# Patient Record
Sex: Female | Born: 1990 | Race: White | Hispanic: No | Marital: Married | Smoking: Current every day smoker
Health system: Southern US, Community
[De-identification: ages and names within clinical notes are randomized; demographics above are authoritative.]

## PROBLEM LIST (undated history)

## (undated) ENCOUNTER — Inpatient Hospital Stay (HOSPITAL_COMMUNITY): Payer: Self-pay

## (undated) DIAGNOSIS — B379 Candidiasis, unspecified: Secondary | ICD-10-CM

## (undated) DIAGNOSIS — S82899A Other fracture of unspecified lower leg, initial encounter for closed fracture: Secondary | ICD-10-CM

## (undated) DIAGNOSIS — S62109A Fracture of unspecified carpal bone, unspecified wrist, initial encounter for closed fracture: Secondary | ICD-10-CM

## (undated) DIAGNOSIS — S62609A Fracture of unspecified phalanx of unspecified finger, initial encounter for closed fracture: Secondary | ICD-10-CM

## (undated) DIAGNOSIS — A499 Bacterial infection, unspecified: Secondary | ICD-10-CM

## (undated) DIAGNOSIS — R51 Headache: Secondary | ICD-10-CM

## (undated) DIAGNOSIS — F32A Depression, unspecified: Secondary | ICD-10-CM

## (undated) DIAGNOSIS — I209 Angina pectoris, unspecified: Secondary | ICD-10-CM

## (undated) DIAGNOSIS — O139 Gestational [pregnancy-induced] hypertension without significant proteinuria, unspecified trimester: Secondary | ICD-10-CM

## (undated) DIAGNOSIS — D649 Anemia, unspecified: Secondary | ICD-10-CM

## (undated) DIAGNOSIS — F329 Major depressive disorder, single episode, unspecified: Secondary | ICD-10-CM

## (undated) DIAGNOSIS — Z8619 Personal history of other infectious and parasitic diseases: Secondary | ICD-10-CM

## (undated) DIAGNOSIS — B999 Unspecified infectious disease: Secondary | ICD-10-CM

## (undated) DIAGNOSIS — I10 Essential (primary) hypertension: Secondary | ICD-10-CM

## (undated) DIAGNOSIS — F419 Anxiety disorder, unspecified: Secondary | ICD-10-CM

## (undated) DIAGNOSIS — R0602 Shortness of breath: Secondary | ICD-10-CM

## (undated) DIAGNOSIS — IMO0002 Reserved for concepts with insufficient information to code with codable children: Secondary | ICD-10-CM

## (undated) HISTORY — DX: Anxiety disorder, unspecified: F41.9

## (undated) HISTORY — DX: Anemia, unspecified: D64.9

## (undated) HISTORY — DX: Candidiasis, unspecified: B37.9

## (undated) HISTORY — DX: Gestational (pregnancy-induced) hypertension without significant proteinuria, unspecified trimester: O13.9

## (undated) HISTORY — DX: Fracture of unspecified phalanx of unspecified finger, initial encounter for closed fracture: S62.609A

## (undated) HISTORY — DX: Fracture of unspecified carpal bone, unspecified wrist, initial encounter for closed fracture: S62.109A

## (undated) HISTORY — DX: Unspecified infectious disease: B99.9

## (undated) HISTORY — DX: Headache: R51

## (undated) HISTORY — DX: Depression, unspecified: F32.A

## (undated) HISTORY — DX: Major depressive disorder, single episode, unspecified: F32.9

## (undated) HISTORY — DX: Bacterial infection, unspecified: A49.9

## (undated) HISTORY — DX: Personal history of other infectious and parasitic diseases: Z86.19

## (undated) HISTORY — DX: Angina pectoris, unspecified: I20.9

## (undated) HISTORY — DX: Shortness of breath: R06.02

## (undated) HISTORY — DX: Reserved for concepts with insufficient information to code with codable children: IMO0002

## (undated) HISTORY — DX: Essential (primary) hypertension: I10

## (undated) HISTORY — PX: APPENDECTOMY: SHX54

## (undated) HISTORY — DX: Other fracture of unspecified lower leg, initial encounter for closed fracture: S82.899A

---

## 2010-03-24 DIAGNOSIS — O139 Gestational [pregnancy-induced] hypertension without significant proteinuria, unspecified trimester: Secondary | ICD-10-CM

## 2010-03-24 HISTORY — DX: Gestational (pregnancy-induced) hypertension without significant proteinuria, unspecified trimester: O13.9

## 2011-03-25 NOTE — L&D Delivery Note (Signed)
Delivery Note At 1:39 PM a viable female was delivered via Vaginal, Spontaneous Delivery (Presentation: Left Occiput Anterior).  Loose nuchal cord reduced, infant spontaneously rotated to direct OP, mild shoulder dystocia <30 sec relieved w suprapubic pressure and mcroberts, APGAR: 9, 9; weight - pending  Placenta status: Intact, Spontaneous.  Cord: 3 vessels with the following complications: None.  Cord pH: n/a   Anesthesia: Epidural, local  Episiotomy: None Lacerations: 1st degree;LL Periurethral Suture Repair: 3.0 vicryl rapide 4-0 monocryl  Est. Blood Loss (mL): 200  Mom to postpartum.  Baby to nursery-stable. Pt plans to BF, considering BTL - no consent at present   Denym Christenberry M 03/02/2012, 2:28 PM

## 2011-07-16 ENCOUNTER — Encounter (HOSPITAL_COMMUNITY): Payer: Self-pay | Admitting: Family Medicine

## 2011-07-16 ENCOUNTER — Emergency Department (HOSPITAL_COMMUNITY)
Admission: EM | Admit: 2011-07-16 | Discharge: 2011-07-16 | Disposition: A | Discharge status: Home / Self Care | Payer: Medicaid Other | Attending: Emergency Medicine | Admitting: Emergency Medicine

## 2011-07-16 DIAGNOSIS — O219 Vomiting of pregnancy, unspecified: Secondary | ICD-10-CM

## 2011-07-16 DIAGNOSIS — F17200 Nicotine dependence, unspecified, uncomplicated: Secondary | ICD-10-CM | POA: Insufficient documentation

## 2011-07-16 DIAGNOSIS — O210 Mild hyperemesis gravidarum: Secondary | ICD-10-CM | POA: Insufficient documentation

## 2011-07-16 DIAGNOSIS — Z3201 Encounter for pregnancy test, result positive: Secondary | ICD-10-CM

## 2011-07-16 LAB — URINALYSIS, ROUTINE W REFLEX MICROSCOPIC
Bilirubin Urine: NEGATIVE
Hgb urine dipstick: NEGATIVE
Ketones, ur: NEGATIVE mg/dL
Nitrite: NEGATIVE
pH: 7 (ref 5.0–8.0)

## 2011-07-16 LAB — URINE MICROSCOPIC-ADD ON

## 2011-07-16 MED ORDER — ONDANSETRON 8 MG PO TBDP: 8.0000 mg | ORAL_TABLET | Freq: Three times a day (TID) | ORAL | Status: AC | PRN

## 2011-07-16 NOTE — Discharge Instructions (Signed)
Morning Sickness Morning sickness is when you feel sick to your stomach (nauseous) during pregnancy. This nauseous feeling may or may not come with throwing up (vomiting). It often occurs in the morning, but can be a problem any time of day. While morning sickness is unpleasant, it is usually harmless unless you develop severe and continual vomiting (hyperemesis gravidarum). This condition requires more intense treatment. CAUSES  The cause of morning sickness is not completely known but seems to be related to a sudden increase of two hormones:   Human chorionic gonadotropin (hCG).   Estrogen hormone.  These are elevated in the first part of the pregnancy. TREATMENT  Do not use any medicines (prescription, over-the-counter, or herbal) for morning sickness without first talking to your caregiver. Some patients are helped by the following:  Vitamin B6 (25mg every 8 hours) or vitamin B6 shots.   An antihistamine called doxylamine (10mg every 8 hours).   The herbal medication ginger.  HOME CARE INSTRUCTIONS   Taking multivitamins before getting pregnant can prevent or decrease the severity of morning sickness in most women.   Eat a piece of dry toast or unsalted crackers before getting out of bed in the morning.   Eat 5 or 6 small meals a day.   Eat dry and bland foods (rice, baked potato).   Do not drink liquids with your meals. Drink liquids between meals.   Avoid greasy, fatty, and spicy foods.   Get someone to cook for you if the smell of any food causes nausea and vomiting.   Avoid vitamin pills with iron because iron can cause nausea.   Snack on protein foods between meals if you are hungry.   Eat unsweetened gelatins for deserts.   Wear an acupressure wristband (worn for sea sickness) may be helpful.   Acupuncture may be helpful.   Do not smoke.   Get a humidifier to keep the air in your house free of odors.  SEEK MEDICAL CARE IF:   Your home remedies are not working  and you need medication.   You feel dizzy or lightheaded.   You are losing weight.   You need help with your diet.  SEEK IMMEDIATE MEDICAL CARE IF:   You have persistent and uncontrolled nausea and vomiting.   You pass out (faint).   You have a fever.  MAKE SURE YOU:   Understand these instructions.   Will watch your condition.   Will get help right away if you are not doing well or get worse.  Document Released: 05/01/2006 Document Revised: 02/27/2011 Document Reviewed: 02/26/2007 [employer] Patient Information 2012 [employer]. 

## 2011-07-16 NOTE — ED Notes (Signed)
Patient states she has "thrown up about 8 times" since 8am. Has not taken any meds.

## 2011-07-16 NOTE — ED Provider Notes (Signed)
History     CSN: [id number]  Arrival date & time 07/16/11  6295   First MD Initiated Contact with Patient 07/16/11 2014     8:53 PM Patient reports this morning he has had nausea and vomiting x8. Denies abdominal pain, diarrhea, fever, hematochezia, or hematemesis. Reports last menstrual cycle was the beginning of March. Reports usually has abnormal periods. Patient is a 21 y.o. female presenting with vomiting. The history is provided by the patient.  Emesis  This is a new problem. The current episode started 12 to 24 hours ago. The problem occurs 5 to 10 times per day. The problem has not changed since onset.The emesis has an appearance of bilious material. There has been no fever. Pertinent negatives include no abdominal pain, no chills, no cough, no diarrhea, no fever, no headaches and no URI.    Past Medical History  Diagnosis Date  . Asthma     Past Surgical History  Procedure Date  . Appendectomy     History reviewed. No pertinent family history.  History  Substance Use Topics  . Smoking status: Current Everyday Smoker -- 1.0 packs/day    Types: Cigarettes  . Smokeless tobacco: Not on file  . Alcohol Use: Yes     Occasionally    OB History    Grav Para Term Preterm Abortions TAB SAB Ect Mult Living                  Review of Systems  Constitutional: Negative for fever and chills.  Respiratory: Negative for cough and shortness of breath.   Cardiovascular: Negative for chest pain.  Gastrointestinal: Positive for nausea and vomiting. Negative for abdominal pain, diarrhea and constipation.  Genitourinary: Negative for dysuria, urgency, frequency, hematuria, flank pain, vaginal bleeding, vaginal discharge and vaginal pain.  Musculoskeletal: Negative for back pain.  Neurological: Negative for headaches.  All other systems reviewed and are negative.    Allergies  Review of patient's allergies indicates no known allergies.  Home Medications   Current Outpatient  Rx  Name Route Sig Dispense Refill  . ACETAMINOPHEN 500 MG PO TABS Oral Take 500 mg by mouth every 6 (six) hours as needed. Relief migraine pain    . ADULT MULTIVITAMIN W/MINERALS CH Oral Take 1 tablet by mouth daily.      BP 109/56  Pulse 78  Temp(Src) 98.6 F (37 C) (Oral)  Resp 20  Wt 217 lb (98.431 kg)  SpO2 99%  LMP 05/26/2011  Physical Exam  Vitals reviewed. Constitutional: She is oriented to person, place, and time. Vital signs are normal. She appears well-developed and well-nourished.  HENT:  Head: Normocephalic and atraumatic.  Eyes: Conjunctivae are normal. Pupils are equal, round, and reactive to light.  Neck: Normal range of motion. Neck supple.  Cardiovascular: Normal rate, regular rhythm and normal heart sounds.  Exam reveals no friction rub.   No murmur heard. Pulmonary/Chest: Effort normal and breath sounds normal. She has no wheezes. She has no rhonchi. She has no rales. She exhibits no tenderness.  Abdominal: Soft. Bowel sounds are normal. She exhibits no distension and no mass. There is no tenderness. There is no rebound and no guarding.       Obese   Musculoskeletal: Normal range of motion.  Neurological: She is alert and oriented to person, place, and time. Coordination normal.  Skin: Skin is warm and dry. No rash noted. No erythema. No pallor.    ED Course  Procedures   Results for orders  placed during the hospital encounter of 07/16/11  URINALYSIS, ROUTINE W REFLEX MICROSCOPIC      Component Value Range   Color, Urine YELLOW  YELLOW    APPearance CLOUDY (*) CLEAR    Specific Gravity, Urine 1.019  1.005 - 1.030    pH 7.0  5.0 - 8.0    Glucose, UA NEGATIVE  NEGATIVE (mg/dL)   Hgb urine dipstick NEGATIVE  NEGATIVE    Bilirubin Urine NEGATIVE  NEGATIVE    Ketones, ur NEGATIVE  NEGATIVE (mg/dL)   Protein, ur NEGATIVE  NEGATIVE (mg/dL)   Urobilinogen, UA 1.0  0.0 - 1.0 (mg/dL)   Nitrite NEGATIVE  NEGATIVE    Leukocytes, UA LARGE (*) NEGATIVE   POCT  PREGNANCY, URINE      Component Value Range   Preg Test, Ur POSITIVE (*) NEGATIVE   URINE MICROSCOPIC-ADD ON      Component Value Range   Squamous Epithelial / LPF FEW (*) RARE    WBC, UA 11-20  <3 (WBC/hpf)   Bacteria, UA FEW (*) RARE    Urine-Other FEW YEAST     No results found.    MDM    Patient is positive for pregnancy. Will also order a urine culture to rule out UTI. Patient does not currently have UTI symptoms so I do not feel the need to start her on an antibiotic. Discussed starting a prenatal vitamin. Also give prescription for Zofran. Patient agrees with plan and is ready for discharge     Thomasene Lot, Cordelia Poche 07/17/11 1610

## 2011-07-18 LAB — URINE CULTURE

## 2011-07-18 NOTE — ED Provider Notes (Signed)
Medical screening examination/treatment/procedure(s) were performed by non-physician practitioner and as supervising physician I was immediately available for consultation/collaboration.   Samai Corea, MD 07/18/11 0708 

## 2011-08-20 ENCOUNTER — Emergency Department (HOSPITAL_COMMUNITY): Payer: Medicaid Other

## 2011-08-20 ENCOUNTER — Encounter (HOSPITAL_COMMUNITY): Payer: Self-pay | Admitting: Family Medicine

## 2011-08-20 ENCOUNTER — Emergency Department (HOSPITAL_COMMUNITY)
Admission: EM | Admit: 2011-08-20 | Discharge: 2011-08-20 | Disposition: A | Discharge status: Home / Self Care | Payer: Medicaid Other | Attending: Emergency Medicine | Admitting: Emergency Medicine

## 2011-08-20 ENCOUNTER — Other Ambulatory Visit (HOSPITAL_COMMUNITY): Payer: Self-pay

## 2011-08-20 DIAGNOSIS — N949 Unspecified condition associated with female genital organs and menstrual cycle: Secondary | ICD-10-CM

## 2011-08-20 DIAGNOSIS — O210 Mild hyperemesis gravidarum: Secondary | ICD-10-CM | POA: Insufficient documentation

## 2011-08-20 DIAGNOSIS — F17200 Nicotine dependence, unspecified, uncomplicated: Secondary | ICD-10-CM | POA: Insufficient documentation

## 2011-08-20 DIAGNOSIS — O99891 Other specified diseases and conditions complicating pregnancy: Secondary | ICD-10-CM | POA: Insufficient documentation

## 2011-08-20 DIAGNOSIS — O2390 Unspecified genitourinary tract infection in pregnancy, unspecified trimester: Secondary | ICD-10-CM | POA: Insufficient documentation

## 2011-08-20 DIAGNOSIS — R109 Unspecified abdominal pain: Secondary | ICD-10-CM | POA: Insufficient documentation

## 2011-08-20 DIAGNOSIS — N390 Urinary tract infection, site not specified: Secondary | ICD-10-CM | POA: Insufficient documentation

## 2011-08-20 LAB — URINALYSIS, ROUTINE W REFLEX MICROSCOPIC
Bilirubin Urine: NEGATIVE
Glucose, UA: NEGATIVE mg/dL
Specific Gravity, Urine: 1.026 (ref 1.005–1.030)
Urobilinogen, UA: 0.2 mg/dL (ref 0.0–1.0)
pH: 6 (ref 5.0–8.0)

## 2011-08-20 MED ORDER — ONDANSETRON HCL 4 MG/2ML IJ SOLN
4.0000 mg | Freq: Once | INTRAMUSCULAR | Status: AC
  Administered 2011-08-20: 4 mg via INTRAVENOUS
  Filled 2011-08-20: qty 2

## 2011-08-20 MED ORDER — SODIUM CHLORIDE 0.9 % IV SOLN: Freq: Once | INTRAVENOUS | Status: DC

## 2011-08-20 MED ORDER — CEPHALEXIN 500 MG PO CAPS: 500.0000 mg | ORAL_CAPSULE | Freq: Four times a day (QID) | ORAL | Status: AC

## 2011-08-20 MED ORDER — SODIUM CHLORIDE 0.9 % IV BOLUS (SEPSIS)
1000.0000 mL | Freq: Once | INTRAVENOUS | Status: AC
  Administered 2011-08-20: 1000 mL via INTRAVENOUS

## 2011-08-20 NOTE — Discharge Instructions (Signed)

## 2011-08-20 NOTE — ED Provider Notes (Signed)
History     CSN: [id number]  Arrival date & time 08/20/11  1452   First MD Initiated Contact with Patient 08/20/11 1503      Chief Complaint  Patient presents with  . Abdominal Pain  . Nausea  . Emesis    (Consider location/radiation/quality/duration/timing/severity/associated sxs/prior treatment) Patient is a 21 y.o. female presenting with abdominal pain and vomiting. The history is provided by the patient and the spouse.  Abdominal Pain The primary symptoms of the illness include abdominal pain and vomiting.  Emesis  Associated symptoms include abdominal pain.   patient here with vomiting abdominal cramping. She is [redacted] weeks pregnant and has not had any prenatal care. Denies any vaginal bleeding or discharge. No dysuria or flank pain. Denies any fever. She is a G2 P1. No medications taken prior to arrival. Vomiting has been nonbilious.  Past Medical History  Diagnosis Date  . Asthma     Past Surgical History  Procedure Date  . Appendectomy     History reviewed. No pertinent family history.  History  Substance Use Topics  . Smoking status: Current Everyday Smoker -- 0.5 packs/day    Types: Cigarettes  . Smokeless tobacco: Not on file  . Alcohol Use: No    OB History    Grav Para Term Preterm Abortions TAB SAB Ect Mult Living   1               Review of Systems  Gastrointestinal: Positive for vomiting and abdominal pain.  All other systems reviewed and are negative.    Allergies  Review of patient's allergies indicates no known allergies.  Home Medications   Current Outpatient Rx  Name Route Sig Dispense Refill  . PRENATAL MULTIVITAMIN CH Oral Take 1 tablet by mouth daily.      BP 128/72  Pulse 101  Temp(Src) 98.4 F (36.9 C) (Oral)  Resp 16  Wt 214 lb 6 oz (97.24 kg)  SpO2 99%  LMP 05/26/2011  Physical Exam  Nursing note and vitals reviewed. Constitutional: She is oriented to person, place, and time. Vital signs are normal. She appears  well-developed and well-nourished.  Non-toxic appearance. No distress.  HENT:  Head: Normocephalic and atraumatic.  Eyes: Conjunctivae, EOM and lids are normal. Pupils are equal, round, and reactive to light.  Neck: Normal range of motion. Neck supple. No tracheal deviation present. No mass present.  Cardiovascular: Normal rate, regular rhythm and normal heart sounds.  Exam reveals no gallop.   No murmur heard. Pulmonary/Chest: Effort normal and breath sounds normal. No stridor. No respiratory distress. She has no decreased breath sounds. She has no wheezes. She has no rhonchi. She has no rales.  Abdominal: Soft. Normal appearance and bowel sounds are normal. She exhibits no distension. There is no tenderness. There is no rebound and no CVA tenderness.  Musculoskeletal: Normal range of motion. She exhibits no edema and no tenderness.  Neurological: She is alert and oriented to person, place, and time. She has normal strength. No cranial nerve deficit or sensory deficit. GCS eye subscore is 4. GCS verbal subscore is 5. GCS motor subscore is 6.  Skin: Skin is warm and dry. No abrasion and no rash noted.  Psychiatric: She has a normal mood and affect. Her speech is normal and behavior is normal.    ED Course  Procedures (including critical care time)   Labs Reviewed  URINALYSIS, ROUTINE W REFLEX MICROSCOPIC  URINE CULTURE  HCG, QUANTITATIVE, PREGNANCY   No results found.  No diagnosis found.    MDM  Patient's ultrasound shows 11 weeks and 4 day IUP. We'll treat her urinary tract infection and she has arranged followup with a OB doctor        Toy Baker, MD 08/20/11 [phone]

## 2011-08-20 NOTE — ED Notes (Signed)
PT reports having lower abdominal pain with N/V starting this morning. Reports one episode of vomiting. States she is [redacted] weeks pregnant has not seen an OB yet. Reports taking prenatal vitamins. Denies any vaginal bleeding or discharge.

## 2011-08-20 NOTE — ED Notes (Signed)
Patient transported to Ultrasound 

## 2011-08-22 LAB — URINE CULTURE
Colony Count: >=100000
Culture  Setup Time: 201305300231

## 2011-08-28 ENCOUNTER — Ambulatory Visit (INDEPENDENT_AMBULATORY_CARE_PROVIDER_SITE_OTHER): Payer: Medicaid Other | Admitting: Obstetrics and Gynecology

## 2011-08-28 ENCOUNTER — Encounter: Payer: Self-pay | Admitting: Obstetrics and Gynecology

## 2011-08-28 VITALS — BP 110/74 | Ht 63.6 in | Wt 213.0 lb

## 2011-08-28 DIAGNOSIS — J45909 Unspecified asthma, uncomplicated: Secondary | ICD-10-CM

## 2011-08-28 DIAGNOSIS — O3680X0 Pregnancy with inconclusive fetal viability, not applicable or unspecified: Secondary | ICD-10-CM

## 2011-08-28 DIAGNOSIS — N898 Other specified noninflammatory disorders of vagina: Secondary | ICD-10-CM

## 2011-08-28 LAB — POCT OSOM BVBLUE TEST: Bacterial Vaginosis: NEGATIVE

## 2011-08-28 MED ORDER — NICOTINE 14 MG/24HR TD PT24: 1.0000 | MEDICATED_PATCH | TRANSDERMAL | Status: DC

## 2011-08-28 MED ORDER — NICOTINE 7 MG/24HR TD PT24: 1.0000 | MEDICATED_PATCH | TRANSDERMAL | Status: DC

## 2011-08-28 NOTE — Progress Notes (Signed)
Subjective:    Katelyn Munoz is a 21 y.o. female, G3P1011, who presents for follow-up from ER visit 08/20/11 for pelvic pain thought to be due to UTI. Urine culture: >=100,000 COLONIES/ML Culture Multiple bacterial morphotypes present, none predominant. Was given Keflex. Learned of pregnancy 05/02/11. Spotting x1. 05/16/11 was told she had a miscarriage because of transvaginal ultrasound. Light cycle 3/4 to 3/6 and amenorrhea since then.  08/20/11 ultrasound 11+4 weeks normal IUP  EDD 03/01/12   13+2 weeks Pain resolved except some ligament pain.     History   Social History  . Marital Status: Married    Spouse Name: N/A    Number of Children: N/A  . Years of Education: N/A   Social History Main Topics  . Smoking status: Current Everyday Smoker -- 0.5 packs/day    Types: Cigarettes  . Smokeless tobacco: None  . Alcohol Use: No  . Drug Use: No  . Sexually Active: Yes    Birth Control/ Protection: None   Other Topics Concern  . None   Social History Narrative  . None    Menstrual cycle:   LMP: Patient's last menstrual period was 05/26/2011.            The following portions of the patient's history were reviewed and updated as appropriate: allergies, current medications, past family history, past medical history, past social history, past surgical history and problem list.  Review of Systems Pertinent items are noted in HPI. Breast:Negative for breast lump,nipple discharge or nipple retraction Gastrointestinal: Negative for abdominal pain, change in bowel habits or rectal bleeding Urinary:negative   Objective:    BP 110/74  Ht 5' 3.6" (1.615 m)  Wt 213 lb (96.616 kg)  BMI 37.02 kg/m2  LMP 05/26/2011    Weight:  Wt Readings from Last 1 Encounters:  08/28/11 213 lb (96.616 kg)          BMI: Body mass index is 37.02 kg/(m^2).  General Appearance: Alert, appropriate appearance for age. No acute distress HEENT: Grossly normal Neck / Thyroid: Supple, no masses, nodes or  enlargement Lungs: clear to auscultation bilaterally Back: No CVA tenderness Breast Exam: No masses or nodes.No dimpling, nipple retraction or discharge. Cardiovascular: Regular rate and rhythm. S1, S2, no murmur Gastrointestinal: Soft, non-tender, no masses or organomegaly Pelvic Exam: Vulva and vagina appear normal. Bimanual exam reveals normal uterus and adnexa. Uterus C/w 13 weeks. FHR 148 bpm. Pelvis proven to 7 lbs 14 oz Rectovaginal: not indicated Lymphatic Exam: Non-palpable nodes in neck, clavicular, axillary, or inguinal regions Skin: no rash or abnormalities Neurologic: Normal gait and speech, no tremor  Psychiatric: Alert and oriented, appropriate affect.   OSOM ZO:[id number]   Assessment:    Viable early pregnancy    Plan:    pap smear counseled on smoking cessation: Nicotine patches prescribed Prenatal panel drawn today, Pap, GC,Chlamydia Follow-up OB interview and NOB same day. Pt desires Quad screen Will need TOC urine      Bilan Tedesco AMD

## 2011-08-28 NOTE — Progress Notes (Signed)
Pt.came today as a new gyn follow from the ER she was seen on 08/20/2011 due to having some pelvic pain . Pt also stated that she 13w 5 d pregnant due sometime in early December . Pt also stated she has not had no prenatal care.last pap was in 2011.

## 2011-08-29 LAB — PRENATAL PANEL VII
Antibody Screen: NEGATIVE
Basophils Absolute: 0 10*3/uL (ref 0.0–0.1)
Basophils Relative: 0 % (ref 0–1)
Eosinophils Relative: 1 % (ref 0–5)
HCT: 37.8 % (ref 36.0–46.0)
HIV: NONREACTIVE
Hemoglobin: 12.9 g/dL (ref 12.0–15.0)
Lymphocytes Relative: 20 % (ref 12–46)
MCHC: 34.1 g/dL (ref 30.0–36.0)
MCV: 86.7 fL (ref 78.0–100.0)
Monocytes Absolute: 0.7 10*3/uL (ref 0.1–1.0)
Monocytes Relative: 5 % (ref 3–12)
Neutro Abs: 9.7 10*3/uL — ABNORMAL HIGH (ref 1.7–7.7)
RDW: 13.5 % (ref 11.5–15.5)
Rh Type: NEGATIVE

## 2011-09-02 ENCOUNTER — Emergency Department (HOSPITAL_COMMUNITY)
Admission: EM | Admit: 2011-09-02 | Discharge: 2011-09-02 | Disposition: A | Discharge status: Home / Self Care | Payer: Medicaid Other | Attending: Emergency Medicine | Admitting: Emergency Medicine

## 2011-09-02 ENCOUNTER — Encounter (HOSPITAL_COMMUNITY): Payer: Self-pay | Admitting: *Deleted

## 2011-09-02 DIAGNOSIS — M899 Disorder of bone, unspecified: Secondary | ICD-10-CM | POA: Insufficient documentation

## 2011-09-02 DIAGNOSIS — Z3490 Encounter for supervision of normal pregnancy, unspecified, unspecified trimester: Secondary | ICD-10-CM

## 2011-09-02 DIAGNOSIS — N390 Urinary tract infection, site not specified: Secondary | ICD-10-CM | POA: Insufficient documentation

## 2011-09-02 DIAGNOSIS — M549 Dorsalgia, unspecified: Secondary | ICD-10-CM | POA: Insufficient documentation

## 2011-09-02 DIAGNOSIS — O99330 Smoking (tobacco) complicating pregnancy, unspecified trimester: Secondary | ICD-10-CM | POA: Insufficient documentation

## 2011-09-02 DIAGNOSIS — M259 Joint disorder, unspecified: Secondary | ICD-10-CM | POA: Insufficient documentation

## 2011-09-02 DIAGNOSIS — D649 Anemia, unspecified: Secondary | ICD-10-CM | POA: Insufficient documentation

## 2011-09-02 DIAGNOSIS — O99019 Anemia complicating pregnancy, unspecified trimester: Secondary | ICD-10-CM | POA: Insufficient documentation

## 2011-09-02 DIAGNOSIS — O2390 Unspecified genitourinary tract infection in pregnancy, unspecified trimester: Secondary | ICD-10-CM | POA: Insufficient documentation

## 2011-09-02 DIAGNOSIS — O169 Unspecified maternal hypertension, unspecified trimester: Secondary | ICD-10-CM | POA: Insufficient documentation

## 2011-09-02 LAB — URINALYSIS, ROUTINE W REFLEX MICROSCOPIC
Glucose, UA: NEGATIVE mg/dL
Hgb urine dipstick: NEGATIVE
Ketones, ur: NEGATIVE mg/dL
Protein, ur: NEGATIVE mg/dL
Urobilinogen, UA: 1 mg/dL (ref 0.0–1.0)

## 2011-09-02 LAB — PAP IG, CT-NG, RFX HPV ASCU

## 2011-09-02 LAB — URINE MICROSCOPIC-ADD ON

## 2011-09-02 MED ORDER — NITROFURANTOIN MONOHYD MACRO 100 MG PO CAPS: 100.0000 mg | ORAL_CAPSULE | Freq: Two times a day (BID) | ORAL | Status: AC

## 2011-09-02 MED ORDER — CYCLOBENZAPRINE HCL 5 MG PO TABS: 5.0000 mg | ORAL_TABLET | Freq: Three times a day (TID) | ORAL | Status: AC | PRN

## 2011-09-02 NOTE — ED Notes (Signed)
Pt states "was [place] the pool in the apt [place] last night & started having low back pain, goes up into my head and into my hips when I move, am [redacted] weeks pregnant"

## 2011-09-02 NOTE — Discharge Instructions (Signed)

## 2011-09-02 NOTE — ED Provider Notes (Signed)
History     CSN: [id number]  Arrival date & time 09/02/11  1013   First MD Initiated Contact with Patient 09/02/11 1042      Chief Complaint  Patient presents with  . Back Pain    (Consider location/radiation/quality/duration/timing/severity/associated sxs/prior treatment) HPI Comments: Patient with a history of hypertension and currently [redacted] weeks pregnant presents emergency department with chief complaint of back pain.  Patient states that last evening she was picking up her other child when she felt a muscle strain in her lower back.  Patient has been taking Tylenol to treat pain however she states this is not working.  Patient denies any fevers, night sweats, chills, abdominal pain, nausea, vomiting, vaginal bleeding or discharge, abdominal cramping, loss control of bowel or bladder, radiation of pain, urinary symptoms including frequency, hematuria, or dysuria.  No other complaints at this time.    Patient is a 21 y.o. female presenting with back pain. The history is provided by the patient.  Back Pain  Pertinent negatives include no chest pain, no fever, no numbness, no headaches, no abdominal pain, no dysuria and no weakness.    Past Medical History  Diagnosis Date  . Asthma   . Hypertension   . Anemia     Past Surgical History  Procedure Date  . Appendectomy     Family History  Problem Relation Age of Onset  . Diabetes Paternal Grandmother   . Cancer Maternal Grandmother     cervical cancer   . Lung cancer Maternal Grandfather     History  Substance Use Topics  . Smoking status: Current Everyday Smoker -- 0.5 packs/day    Types: Cigarettes  . Smokeless tobacco: Not on file  . Alcohol Use: No    OB History    Grav Para Term Preterm Abortions TAB SAB Ect Mult Living   3 1 1  1  1   1       Review of Systems  Constitutional: Negative for fever, chills and appetite change.  HENT: Negative for congestion.   Eyes: Negative for visual disturbance.    Respiratory: Negative for shortness of breath.   Cardiovascular: Negative for chest pain and leg swelling.  Gastrointestinal: Negative for abdominal pain.  Genitourinary: Negative for dysuria, urgency, frequency, vaginal bleeding, vaginal discharge and difficulty urinating.  Musculoskeletal: Positive for back pain.  Neurological: Negative for dizziness, syncope, weakness, light-headedness, numbness and headaches.  Psychiatric/Behavioral: Negative for confusion.  All other systems reviewed and are negative.    Allergies  Tree extract  Home Medications   Current Outpatient Rx  Name Route Sig Dispense Refill  . PRENATAL MULTIVITAMIN CH Oral Take 1 tablet by mouth daily.      BP 132/68  Pulse 94  Temp(Src) 98.3 F (36.8 C) (Oral)  Resp 17  Wt 214 lb (97.07 kg)  SpO2 100%  LMP 05/26/2011  Physical Exam  Nursing note and vitals reviewed. Constitutional: She is oriented to person, place, and time. She appears well-developed and well-nourished. No distress.  HENT:  Head: Normocephalic and atraumatic.  Eyes: Conjunctivae and EOM are normal. Pupils are equal, round, and reactive to light. No scleral icterus.  Neck: Normal range of motion and full passive range of motion without pain. Neck supple. No spinous process tenderness and no muscular tenderness present. No rigidity. Normal range of motion present. No Brudzinski's sign noted.  Cardiovascular: Normal rate, regular rhythm and intact distal pulses.  Exam reveals no gallop and no friction rub.   No murmur  heard. Pulmonary/Chest: Effort normal and breath sounds normal. No respiratory distress. She has no wheezes. She has no rales. She exhibits no tenderness.  Abdominal:       Gravid abdomen non tender.   Musculoskeletal:       Cervical back: She exhibits normal range of motion, no tenderness, no bony tenderness and no pain.       Thoracic back: She exhibits no tenderness, no bony tenderness and no pain.       Lumbar back: She  exhibits tenderness, bony tenderness and pain. She exhibits no spasm and normal pulse.       Right foot: She exhibits no swelling.       Left foot: She exhibits no swelling.       Bilateral lower extremities nontender without color change, baseline range of motion of extremities with intact distal pulses, capillary refill less than 2 seconds bilaterally.  Pt has increased pain w ROM of lumbar spine. Pain w ambulation, no sign of ataxia.  Neurological: She is alert and oriented to person, place, and time. She has normal strength and normal reflexes. No sensory deficit. Gait (no ataxia, slowed and hunched d/t pain ) abnormal.       Sensation at baseline for light touch in all 4 distal extremities, motor symmetric & bilateral 5/5 (hips: abduction, adduction, flexion; knee: flexion & extension; foot: dorsiflexion, plantar flexion, toes: dorsi flexion) Patellar & ankle reflexes intact.   Skin: Skin is warm and dry. No rash noted. She is not diaphoretic. No erythema. No pallor.  Psychiatric: She has a normal mood and affect.    ED Course  Procedures (including critical care time)  Labs Reviewed - No data to display No results found.   No diagnosis found.    MDM  Pregnancy, back pain, UTI  Pt has been diagnosed with a UTI. Pt is afebrile, no CVA tenderness, normotensive, and denies N/V. Pt to be dc home with antibiotics and instructions to follow up with PCP if symptoms persist. Pt given flexeril for back pain and discussed conservative home methods.           Jaci Carrel, [place] 09/02/11 1209

## 2011-09-02 NOTE — ED Provider Notes (Signed)
Medical screening examination/treatment/procedure(s) were performed by non-physician practitioner and as supervising physician I was immediately available for consultation/collaboration.   Lyanne Co, MD 09/02/11 [phone]

## 2011-09-03 ENCOUNTER — Encounter: Payer: Self-pay | Admitting: Obstetrics and Gynecology

## 2011-09-07 DIAGNOSIS — R87619 Unspecified abnormal cytological findings in specimens from cervix uteri: Secondary | ICD-10-CM

## 2011-09-07 DIAGNOSIS — IMO0002 Reserved for concepts with insufficient information to code with codable children: Secondary | ICD-10-CM

## 2011-09-07 HISTORY — DX: Reserved for concepts with insufficient information to code with codable children: IMO0002

## 2011-09-07 HISTORY — DX: Unspecified abnormal cytological findings in specimens from cervix uteri: R87.619

## 2011-09-08 ENCOUNTER — Telehealth: Payer: Self-pay | Admitting: Obstetrics and Gynecology

## 2011-09-08 NOTE — Telephone Encounter (Signed)
Returned pt's call.  States is having nausea in AM and vomiting 1-2 times.   Able to retain food and fluids the rest of the day.   Suggested she eat snack of dry cereal or crackers during the night and when first awakens, then wait 15-20 min. Before getting up. Advised no fried or greasy foods. May wear Sea Bands.  Other diet suggestions given. Pt declines RX at this time.   To call if no  improvement. Pt verbalizes comprehension.

## 2011-10-07 ENCOUNTER — Encounter: Payer: Self-pay | Admitting: Obstetrics and Gynecology

## 2011-10-07 ENCOUNTER — Ambulatory Visit (INDEPENDENT_AMBULATORY_CARE_PROVIDER_SITE_OTHER): Payer: Medicaid Other | Admitting: Obstetrics and Gynecology

## 2011-10-07 VITALS — BP 100/58 | Wt 205.0 lb

## 2011-10-07 DIAGNOSIS — IMO0002 Reserved for concepts with insufficient information to code with codable children: Secondary | ICD-10-CM | POA: Insufficient documentation

## 2011-10-07 DIAGNOSIS — O360990 Maternal care for other rhesus isoimmunization, unspecified trimester, not applicable or unspecified: Secondary | ICD-10-CM

## 2011-10-07 DIAGNOSIS — Z331 Pregnant state, incidental: Secondary | ICD-10-CM

## 2011-10-07 DIAGNOSIS — B373 Candidiasis of vulva and vagina: Secondary | ICD-10-CM

## 2011-10-07 DIAGNOSIS — Z6791 Unspecified blood type, Rh negative: Secondary | ICD-10-CM

## 2011-10-07 DIAGNOSIS — O26899 Other specified pregnancy related conditions, unspecified trimester: Secondary | ICD-10-CM

## 2011-10-07 DIAGNOSIS — N898 Other specified noninflammatory disorders of vagina: Secondary | ICD-10-CM

## 2011-10-07 LAB — POCT URINALYSIS DIPSTICK: Bilirubin, UA: NEGATIVE

## 2011-10-07 LAB — POCT WET PREP (WET MOUNT)
Bacteria Wet Prep HPF POC: NEGATIVE
WBC, Wet Prep HPF POC: POSITIVE

## 2011-10-07 MED ORDER — MICONAZOLE NITRATE 100 MG VA SUPP: 100.0000 mg | Freq: Every day | VAGINAL | Status: AC

## 2011-10-07 NOTE — Patient Instructions (Signed)
ABCs of Pregnancy A Antepartum care is very important. Be sure you see your doctor and get prenatal care as soon as you think you are pregnant. At this time, you will be tested for infection, genetic abnormalities and potential problems with you and the pregnancy. This is the time to discuss diet, exercise, work, medications, labor, pain medication during labor and the possibility of a cesarean delivery. Ask any questions that may concern you. It is important to see your doctor regularly throughout your pregnancy. Avoid exposure to toxic substances and chemicals - such as cleaning solvents, lead and mercury, some insecticides, and paint. Pregnant women should avoid exposure to paint fumes, and fumes that cause you to feel ill, dizzy or faint. When possible, it is a good idea to have a pre-pregnancy consultation with your caregiver to begin some important recommendations your caregiver suggests such as, taking folic acid, exercising, quitting smoking, avoiding alcoholic beverages, etc. B Breastfeeding is the healthiest choice for both you and your baby. It has many nutritional benefits for the baby and health benefits for the mother. It also creates a very tight and loving bond between the baby and mother. Talk to your doctor, your family and friends, and your employer about how you choose to feed your baby and how they can support you in your decision. Not all birth defects can be prevented, but a woman can take actions that may increase her chance of having a healthy baby. Many birth defects happen very early in pregnancy, sometimes before a woman even knows she is pregnant. Birth defects or abnormalities of any child in your or the father's family should be discussed with your caregiver. Get a good support bra as your breast size changes. Wear it especially when you exercise and when nursing.  C Celebrate the news of your pregnancy with the your spouse/father and family. Childbirth classes are helpful to  take for you and the spouse/father because it helps to understand what happens during the pregnancy, labor and delivery. Cesarean delivery should be discussed with your doctor so you are prepared for that possibility. The pros and cons of circumcision if it is a boy, should be discussed with your pediatrician. Cigarette smoking during pregnancy can result in low birth weight babies. It has been associated with infertility, miscarriages, tubal pregnancies, infant death (mortality) and poor health (morbidity) in childhood. Additionally, cigarette smoking may cause long-term learning disabilities. If you smoke, you should try to quit before getting pregnant and not smoke during the pregnancy. Secondary smoke may also harm a mother and her developing baby. It is a good idea to ask people to stop smoking around you during your pregnancy and after the baby is born. Extra calcium is necessary when you are pregnant and is found in your prenatal vitamin, in dairy products, green leafy vegetables and in calcium supplements. D A healthy diet according to your current weight and height, along with vitamins and mineral supplements should be discussed with your caregiver. Domestic abuse or violence should be made known to your doctor right away to get the situation corrected. Drink more water when you exercise to keep hydrated. Discomfort of your back and legs usually develops and progresses from the middle of the second trimester through to delivery of the baby. This is because of the enlarging baby and uterus, which may also affect your balance. Do not take illegal drugs. Illegal drugs can seriously harm the baby and you. Drink extra fluids (water is best) throughout pregnancy to help   your body keep up with the increases in your blood volume. Drink at least 6 to 8 glasses of water, fruit juice, or milk each day. A good way to know you are drinking enough fluid is when your urine looks almost like clear water or is very light  yellow.  E Eat healthy to get the nutrients you and your unborn baby need. Your meals should include the five basic food groups. Exercise (30 minutes of light to moderate exercise a day) is important and encouraged during pregnancy, if there are no medical problems or problems with the pregnancy. Exercise that causes discomfort or dizziness should be stopped and reported to your caregiver. Emotions during pregnancy can change from being ecstatic to depression and should be understood by you, your partner and your family. F Fetal screening with ultrasound, amniocentesis and monitoring during pregnancy and labor is common and sometimes necessary. Take 400 micrograms of folic acid daily both before, when possible, and during the first few months of pregnancy to reduce the risk of birth defects of the brain and spine. All women who could possibly become pregnant should take a vitamin with folic acid, every day. It is also important to eat a healthy diet with fortified foods (enriched grain products, including cereals, rice, breads, and pastas) and foods with natural sources of folate (orange juice, green leafy vegetables, beans, peanuts, broccoli, asparagus, peas, and lentils). The father should be involved with all aspects of the pregnancy including, the prenatal care, childbirth classes, labor, delivery, and postpartum time. Fathers may also have emotional concerns about being a father, financial needs, and raising a family. G Genetic testing should be done appropriately. It is important to know your family and the father's history. If there have been problems with pregnancies or birth defects in your family, report these to your doctor. Also, genetic counselors can talk with you about the information you might need in making decisions about having a family. You can call a major medical center in your area for help in finding a board-certified genetic counselor. Genetic testing and counseling should be done  before pregnancy when possible, especially if there is a history of problems in the mother's or father's family. Certain ethnic backgrounds are more at risk for genetic defects. H Get familiar with the hospital where you will be having your baby. Get to know how long it takes to get there, the labor and delivery area, and the hospital procedures. Be sure your medical insurance is accepted there. Get your home ready for the baby including, clothes, the baby's room (when possible), furniture and car seat. Hand washing is important throughout the day, especially after handling raw meat and poultry, changing the baby's diaper or using the bathroom. This can help prevent the spread of many bacteria and viruses that cause infection. Your hair may become dry and thinner, but will return to normal a few weeks after the baby is born. Heartburn is a common problem that can be treated by taking antacids recommended by your caregiver, eating smaller meals 5 or 6 times a day, not drinking liquids when eating, drinking between meals and raising the head of your bed 2 to 3 inches. I Insurance to cover you, the baby, doctor and hospital should be reviewed so that you will be prepared to pay any costs not covered by your insurance plan. If you do not have medical insurance, there are usually clinics and services available for you in your community. Take 30 milligrams of iron during   your pregnancy as prescribed by your doctor to reduce the risk of low red blood cells (anemia) later in pregnancy. All women of childbearing age should eat a diet rich in iron. J There should be a joint effort for the mother, father and any other children to adapt to the pregnancy financially, emotionally, and psychologically during the pregnancy. Join a support group for moms-to-be. Or, join a class on parenting or childbirth. Have the family participate when possible. K Know your limits. Let your caregiver know if you experience any of the  following:   Pain of any kind.   Strong cramps.   You develop a lot of weight in a short period of time (5 pounds in 3 to 5 days).   Vaginal bleeding, leaking of amniotic fluid.   Headache, vision problems.   Dizziness, fainting, shortness of breath.   Chest pain.   Fever of 102 F (38.9 C) or higher.   Gush of clear fluid from your vagina.   Painful urination.   Domestic violence.   Irregular heartbeat (palpitations).   Rapid beating of the heart (tachycardia).   Constant feeling sick to your stomach (nauseous) and vomiting.   Trouble walking, fluid retention (edema).   Muscle weakness.   If your baby has decreased activity.   Persistent diarrhea.   Abnormal vaginal discharge.   Uterine contractions at 20-minute intervals.   Back pain that travels down your leg.  L Learn and practice that what you eat and drink should be in moderation and healthy for you and your baby. Legal drugs such as alcohol and caffeine are important issues for pregnant women. There is no safe amount of alcohol a woman can drink while pregnant. Fetal alcohol syndrome, a disorder characterized by growth retardation, facial abnormalities, and central nervous system dysfunction, is caused by a woman's use of alcohol during pregnancy. Caffeine, found in tea, coffee, soft drinks and chocolate, should also be limited. Be sure to read labels when trying to cut down on caffeine during pregnancy. More than 200 foods, beverages, and over-the-counter medications contain caffeine and have a high salt content! There are coffees and teas that do not contain caffeine. M Medical conditions such as diabetes, epilepsy, and high blood pressure should be treated and kept under control before pregnancy when possible, but especially during pregnancy. Ask your caregiver about any medications that may need to be changed or adjusted during pregnancy. If you are currently taking any medications, ask your caregiver if it  is safe to take them while you are pregnant or before getting pregnant when possible. Also, be sure to discuss any herbs or vitamins you are taking. They are medicines, too! Discuss with your doctor all medications, prescribed and over-the-counter, that you are taking. During your prenatal visit, discuss the medications your doctor may give you during labor and delivery. N Never be afraid to ask your doctor or caregiver questions about your health, the progress of the pregnancy, family problems, stressful situations, and recommendation for a pediatrician, if you do not have one. It is better to take all precautions and discuss any questions or concerns you may have during your office visits. It is a good idea to write down your questions before you visit the doctor. O Over-the-counter cough and cold remedies may contain alcohol or other ingredients that should be avoided during pregnancy. Ask your caregiver about prescription, herbs or over-the-counter medications that you are taking or may consider taking while pregnant.  P Physical activity during pregnancy can   benefit both you and your baby by lessening discomfort and fatigue, providing a sense of well-being, and increasing the likelihood of early recovery after delivery. Light to moderate exercise during pregnancy strengthens the belly (abdominal) and back muscles. This helps improve posture. Practicing yoga, walking, swimming, and cycling on a stationary bicycle are usually safe exercises for pregnant women. Avoid scuba diving, exercise at high altitudes (over 3000 feet), skiing, horseback riding, contact sports, etc. Always check with your doctor before beginning any kind of exercise, especially during pregnancy and especially if you did not exercise before getting pregnant. Q Queasiness, stomach upset and morning sickness are common during pregnancy. Eating a couple of crackers or dry toast before getting out of bed. Foods that you normally love may  make you feel sick to your stomach. You may need to substitute other nutritious foods. Eating 5 or 6 small meals a day instead of 3 large ones may make you feel better. Do not drink with your meals, drink between meals. Questions that you have should be written down and asked during your prenatal visits. R Read about and make plans to baby-proof your home. There are important tips for making your home a safer environment for your baby. Review the tips and make your home safer for you and your baby. Read food labels regarding calories, salt and fat content in the food. S Saunas, hot tubs, and steam rooms should be avoided while you are pregnant. Excessive high heat may be harmful during your pregnancy. Your caregiver will screen and examine you for sexually transmitted diseases and genetic disorders during your prenatal visits. Learn the signs of labor. Sexual relations while pregnant is safe unless there is a medical or pregnancy problem and your caregiver advises against it. T Traveling long distances should be avoided especially in the third trimester of your pregnancy. If you do have to travel out of state, be sure to take a copy of your medical records and medical insurance plan with you. You should not travel long distances without seeing your doctor first. Most airlines will not allow you to travel after 36 weeks of pregnancy. Toxoplasmosis is an infection caused by a parasite that can seriously harm an unborn baby. Avoid eating undercooked meat and handling cat litter. Be sure to wear gloves when gardening. Tingling of the hands and fingers is not unusual and is due to fluid retention. This will go away after the baby is born. U Womb (uterus) size increases during the first trimester. Your kidneys will begin to function more efficiently. This may cause you to feel the need to urinate more often. You may also leak urine when sneezing, coughing or laughing. This is due to the growing uterus pressing  against your bladder, which lies directly in front of and slightly under the uterus during the first few months of pregnancy. If you experience burning along with frequency of urination or bloody urine, be sure to tell your doctor. The size of your uterus in the third trimester may cause a problem with your balance. It is advisable to maintain good posture and avoid wearing high heels during this time. An ultrasound of your baby may be necessary during your pregnancy and is safe for you and your baby. V Vaccinations are an important concern for pregnant women. Get needed vaccines before pregnancy. Center for Disease Control (www.cdc.gov) has clear guidelines for the use of vaccines during pregnancy. Review the list, be sure to discuss it with your doctor. Prenatal vitamins are helpful   and healthy for you and the baby. Do not take extra vitamins except what is recommended. Taking too much of certain vitamins can cause overdose problems. Continuous vomiting should be reported to your caregiver. Varicose veins may appear especially if there is a family history of varicose veins. They should subside after the delivery of the baby. Support hose helps if there is leg discomfort. W Being overweight or underweight during pregnancy may cause problems. Try to get within 15 pounds of your ideal weight before pregnancy. Remember, pregnancy is not a time to be dieting! Do not stop eating or start skipping meals as your weight increases. Both you and your baby need the calories and nutrition you receive from a healthy diet. Be sure to consult with your doctor about your diet. There is a formula and diet plan available depending on whether you are overweight or underweight. Your caregiver or nutritionist can help and advise you if necessary. X Avoid X-rays. If you must have dental work or diagnostic tests, tell your dentist or physician that you are pregnant so that extra care can be taken. X-rays should only be taken when  the risks of not taking them outweigh the risk of taking them. If needed, only the minimum amount of radiation should be used. When X-rays are necessary, protective lead shields should be used to cover areas of the body that are not being X-rayed. Y Your baby loves you. Breastfeeding your baby creates a loving and very close bond between the two of you. Give your baby a healthy environment to live in while you are pregnant. Infants and children require constant care and guidance. Their health and safety should be carefully watched at all times. After the baby is born, rest or take a nap when the baby is sleeping. Z Get your ZZZs. Be sure to get plenty of rest. Resting on your side as often as possible, especially on your left side is advised. It provides the best circulation to your baby and helps reduce swelling. Try taking a nap for 30 to 45 minutes in the afternoon when possible. After the baby is born rest or take a nap when the baby is sleeping. Try elevating your feet for that amount of time when possible. It helps the circulation in your legs and helps reduce swelling.  Most information courtesy of the CDC. Document Released: 03/10/2005 Document Revised: 02/27/2011 Document Reviewed: 11/22/2008 [employer] Patient Information 2012 [employer]. 

## 2011-10-07 NOTE — Progress Notes (Signed)
Pt did not complete Keflex prescribed 08/21/11 for UTI.

## 2011-10-07 NOTE — Progress Notes (Signed)
Pt here for New OB exam Pt with complaints right foot numbness and migraines Physical Examination: General appearance - alert, well appearing, and in no distress Mental status - normal mood, behavior, speech, dress, motor activity, and thought processes Neck - supple, no significant adenopathy, thyroid exam: thyroid is normal in size without nodules or tenderness Chest - clear to auscultation, no wheezes, rales or rhonchi, symmetric air entry Heart - normal rate and regular rhythm Abdomen - soft, nontender, nondistended, no masses or organomegaly Breasts - breasts appear normal, no suspicious masses, no skin or nipple changes or axillary nodes Pelvic - normal external genitalia, vulva, vagina, cervix, uterus and adnexa Rectal - normal rectal, no masses, rectal exam not indicated Back exam - full range of motion, no tenderness, palpable spasm or pain on motion Neurological - alert, oriented, normal speech, no focal findings or movement disorder noted Musculoskeletal - no joint tenderness, deformity or swelling Extremities - no edema, redness or tenderness in the calves or thighs Skin - normal coloration and turgor, no rashes, no suspicious skin lesions noted Routine exam Pap sent ASCUS neg HR HPV repeat in one year refer to neurology.  [place] for anantomy @[facility]  RT 1 week.  Monistat for yeast

## 2011-10-07 NOTE — Progress Notes (Signed)
NOB work-up today . Last pap was 08/28/2011 with ascus cultures was neg. Pt stated sometime has numbness in right foot. No other issues today

## 2011-10-07 NOTE — Progress Notes (Signed)
Additional note.   Pt states feels gloomy and very tired. Has never been diagnosed with depression.  Is interested in counseling.  NS

## 2011-10-08 LAB — PRENATAL PANEL VII
Basophils Absolute: 0 10*3/uL (ref 0.0–0.1)
Eosinophils Relative: 1 % (ref 0–5)
HCT: 35.4 % — ABNORMAL LOW (ref 36.0–46.0)
HIV: NONREACTIVE
Hemoglobin: 11.9 g/dL — ABNORMAL LOW (ref 12.0–15.0)
Lymphocytes Relative: 19 % (ref 12–46)
Lymphs Abs: 2.6 10*3/uL (ref 0.7–4.0)
MCV: 87 fL (ref 78.0–100.0)
Monocytes Absolute: 0.6 10*3/uL (ref 0.1–1.0)
Monocytes Relative: 5 % (ref 3–12)
Neutro Abs: 9.8 10*3/uL — ABNORMAL HIGH (ref 1.7–7.7)
RBC: 4.07 MIL/uL (ref 3.87–5.11)
Rh Type: NEGATIVE
Rubella: 50 IU/mL — ABNORMAL HIGH
WBC: 13.1 10*3/uL — ABNORMAL HIGH (ref 4.0–10.5)

## 2011-10-09 LAB — AFP, QUAD SCREEN
Curr Gest Age: 18.3 wks.days
INH: 252.7 pg/mL
Interpretation-AFP: NEGATIVE
MoM for AFP: 1.13
Osb Risk: 1:8540 {titer}
uE3 Mom: 0.91
uE3 Value: 0.8 ng/mL

## 2011-10-09 LAB — CULTURE, OB URINE

## 2011-10-16 ENCOUNTER — Ambulatory Visit (INDEPENDENT_AMBULATORY_CARE_PROVIDER_SITE_OTHER): Payer: Medicaid Other | Admitting: Obstetrics and Gynecology

## 2011-10-16 ENCOUNTER — Ambulatory Visit (INDEPENDENT_AMBULATORY_CARE_PROVIDER_SITE_OTHER): Payer: Medicaid Other

## 2011-10-16 ENCOUNTER — Encounter: Payer: Self-pay | Admitting: Obstetrics and Gynecology

## 2011-10-16 VITALS — BP 112/60 | Wt 213.0 lb

## 2011-10-16 DIAGNOSIS — Z331 Pregnant state, incidental: Secondary | ICD-10-CM

## 2011-10-16 DIAGNOSIS — N390 Urinary tract infection, site not specified: Secondary | ICD-10-CM

## 2011-10-16 DIAGNOSIS — Z3689 Encounter for other specified antenatal screening: Secondary | ICD-10-CM

## 2011-10-16 MED ORDER — NITROFURANTOIN MONOHYD MACRO 100 MG PO CAPS: 100.0000 mg | ORAL_CAPSULE | Freq: Two times a day (BID) | ORAL | Status: AC

## 2011-10-16 NOTE — Progress Notes (Signed)
No complaints

## 2011-10-16 NOTE — Progress Notes (Signed)
The patient says that she does feel sad and emotional.  She agrees to evaluation at the [facility]. Complaints of dysuria.  Urine culture showed 50,000 colonies of Escherichia coli. Macrobid twice each day for 7 days. Ultrasound: Single gestation, normal fluid, normal anatomy except that the stomach was small, female, cervix 3.79 cm, 19 weeks and 5 days. Patient states that her last menstrual period was completely normal.  The patient had an ultrasound performed at 11 weeks and 4 days that confirmed the gestational age based on last menstrual period.  EDC is March 01, 2012. Repeat ultrasound for growth between 26 and 30 weeks. Return to office in 4 weeks. Dr. Stefano Gaul

## 2011-10-20 ENCOUNTER — Telehealth: Payer: Self-pay

## 2011-10-20 MED ORDER — SULFAMETHOXAZOLE-TMP DS 800-160 MG PO TABS: 1.0000 | ORAL_TABLET | Freq: Two times a day (BID) | ORAL | Status: AC

## 2011-10-20 NOTE — Telephone Encounter (Signed)
Spoke with pt rgd labs informed have uti need abx informed abx sent to pharm wil do toc at next visit pt voice understanding

## 2011-10-20 NOTE — Telephone Encounter (Signed)
Message copied by Rolla Plate on Mon Oct 20, 2011 11:52 AM ------      Message from: Jaymes Graff      Created: Sun Oct 19, 2011 11:20 PM       Please tell pt she has a UTI and needs to take Bactrim DS one tab twice a day for five days .  Dispense ten tablets.  She will need a TOC @[facility] 

## 2011-10-23 LAB — US OB COMP + 14 WK

## 2011-11-12 DIAGNOSIS — O2340 Unspecified infection of urinary tract in pregnancy, unspecified trimester: Secondary | ICD-10-CM | POA: Insufficient documentation

## 2011-11-13 ENCOUNTER — Encounter: Payer: Self-pay | Admitting: Obstetrics and Gynecology

## 2011-11-13 ENCOUNTER — Ambulatory Visit (INDEPENDENT_AMBULATORY_CARE_PROVIDER_SITE_OTHER): Payer: Medicaid Other | Admitting: Obstetrics and Gynecology

## 2011-11-13 VITALS — BP 110/68 | Temp 98.9°F | Wt 216.0 lb

## 2011-11-13 DIAGNOSIS — O2340 Unspecified infection of urinary tract in pregnancy, unspecified trimester: Secondary | ICD-10-CM

## 2011-11-13 DIAGNOSIS — N390 Urinary tract infection, site not specified: Secondary | ICD-10-CM

## 2011-11-13 DIAGNOSIS — J029 Acute pharyngitis, unspecified: Secondary | ICD-10-CM

## 2011-11-13 DIAGNOSIS — O2390 Unspecified genitourinary tract infection in pregnancy, unspecified trimester: Secondary | ICD-10-CM

## 2011-11-13 LAB — POCT URINALYSIS DIPSTICK
Blood, UA: NEGATIVE
Nitrite, UA: NEGATIVE
Spec Grav, UA: 1.015
Urobilinogen, UA: NEGATIVE
pH, UA: 6

## 2011-11-13 LAB — RAPID STREP SCREEN (MED CTR MEBANE ONLY): Streptococcus, Group A Screen (Direct): NEGATIVE

## 2011-11-13 NOTE — Progress Notes (Signed)
Sore throat and nasal congestion--felt she had fever last night. Throat red.  Ears clear. No cough. Rapid strep testing done. Glucola [facility], wth Hgb and RPR. Watch FH--slight S>D today. TOC urine culture today--did not complete previous Rx, but no current symptoms Mood improved--did not seek appointment at [facility] Needs Rhophylac plan at [facility].

## 2011-11-13 NOTE — Progress Notes (Signed)
C/o cold sx's since yesterday  No void for clean catch h20 given

## 2011-11-13 NOTE — Addendum Note (Signed)
Addended by: Cornelius Moras on: 11/13/2011 02:15 PM   Modules accepted: Orders

## 2011-11-13 NOTE — Addendum Note (Signed)
Addended by: Janeece Agee on: 11/13/2011 05:26 PM   Modules accepted: Orders

## 2011-11-13 NOTE — Progress Notes (Signed)
Patient did not leave urine specimen--will check TOC [facility].

## 2011-11-14 ENCOUNTER — Telehealth: Payer: Self-pay | Admitting: Obstetrics and Gynecology

## 2011-11-14 NOTE — Telephone Encounter (Signed)
Tc to pt per telephone call. Told pt rapid strep test=negative.

## 2011-11-21 ENCOUNTER — Telehealth: Payer: Self-pay | Admitting: Obstetrics and Gynecology

## 2011-11-21 NOTE — Telephone Encounter (Signed)
Pt called, is 25w 4d, has been having back spasms and lower abd pain, denies any vaginal bldg and does have +fm.  Pt states has tried Tylenol without any relief.  No other meds taken.  Pt advised to try taking Ibuprofen 600 mg Q 6 hours and to make sure she is pushing her fluids.  Also offered pt an appt for next week.  Pt says will try taking the Ibuprofen and pushing fluids first if she gets no relief will call after hours over the weekend or call next week for an appt.  Pt voices understanding.

## 2011-12-11 ENCOUNTER — Other Ambulatory Visit: Payer: Medicaid Other

## 2011-12-11 ENCOUNTER — Ambulatory Visit (INDEPENDENT_AMBULATORY_CARE_PROVIDER_SITE_OTHER): Payer: Medicaid Other | Admitting: Obstetrics and Gynecology

## 2011-12-11 ENCOUNTER — Encounter: Payer: Self-pay | Admitting: Obstetrics and Gynecology

## 2011-12-11 VITALS — BP 120/70 | Wt 221.0 lb

## 2011-12-11 DIAGNOSIS — Z331 Pregnant state, incidental: Secondary | ICD-10-CM

## 2011-12-11 LAB — HEMOGLOBIN: Hemoglobin: 10.9 g/dL — ABNORMAL LOW (ref 12.0–15.0)

## 2011-12-11 NOTE — Progress Notes (Signed)
[redacted]w[redacted]d Rh-.  RhoGAM ordered. Childbirth classes, preterm labor, contraception, diet and exercise all reviewed. Sign tubal papers today. Vasectomy discussed. Glucola, hemoglobin, RPR today. Return to office in 2 weeks. Dr. Stefano Gaul

## 2011-12-11 NOTE — Progress Notes (Signed)
Glucola given today. 

## 2011-12-12 LAB — RPR

## 2011-12-25 ENCOUNTER — Ambulatory Visit (INDEPENDENT_AMBULATORY_CARE_PROVIDER_SITE_OTHER): Payer: Medicaid Other | Admitting: Obstetrics and Gynecology

## 2011-12-25 VITALS — BP 110/60 | Wt 221.0 lb

## 2011-12-25 DIAGNOSIS — Z331 Pregnant state, incidental: Secondary | ICD-10-CM

## 2011-12-25 NOTE — Progress Notes (Signed)
[redacted]w[redacted]d

## 2011-12-25 NOTE — Progress Notes (Signed)
[redacted]w[redacted]d Complains of continued low back pain.  Referred to physical therapy. Hemoglobin 10.9.  Glucola 108.  RPR nonreactive.  Take iron. Return to office in 2 weeks. Dr. Stefano Gaul

## 2011-12-29 ENCOUNTER — Telehealth: Payer: Self-pay | Admitting: Obstetrics and Gynecology

## 2011-12-29 NOTE — Telephone Encounter (Signed)
Several attempts made to call pt to schedule PT appt.  "Person you are trying to call is not reachable."

## 2012-01-06 ENCOUNTER — Ambulatory Visit: Payer: Medicaid Other | Attending: Obstetrics and Gynecology | Admitting: Physical Therapy

## 2012-01-06 DIAGNOSIS — M545 Low back pain, unspecified: Secondary | ICD-10-CM | POA: Insufficient documentation

## 2012-01-06 DIAGNOSIS — IMO0001 Reserved for inherently not codable concepts without codable children: Secondary | ICD-10-CM | POA: Insufficient documentation

## 2012-01-06 DIAGNOSIS — R293 Abnormal posture: Secondary | ICD-10-CM | POA: Insufficient documentation

## 2012-01-07 ENCOUNTER — Ambulatory Visit (INDEPENDENT_AMBULATORY_CARE_PROVIDER_SITE_OTHER): Payer: Medicaid Other | Admitting: Obstetrics and Gynecology

## 2012-01-07 VITALS — BP 100/62 | Wt 221.0 lb

## 2012-01-07 DIAGNOSIS — Z331 Pregnant state, incidental: Secondary | ICD-10-CM

## 2012-01-07 NOTE — Progress Notes (Signed)
C/o: Back pain, pt started physical therapy this week for her back pain.

## 2012-01-07 NOTE — Progress Notes (Signed)
[redacted]w[redacted]d Continue back pain.  She saw physical therapy yesterday.  Stretching exercises recommended. Tylenol as needed. Return office in 2 weeks. Dr. Stefano Gaul

## 2012-01-14 ENCOUNTER — Ambulatory Visit: Payer: Medicaid Other | Admitting: Rehabilitation

## 2012-01-20 ENCOUNTER — Encounter: Payer: Self-pay | Admitting: Obstetrics and Gynecology

## 2012-01-20 ENCOUNTER — Ambulatory Visit (INDEPENDENT_AMBULATORY_CARE_PROVIDER_SITE_OTHER): Payer: Medicaid Other | Admitting: Obstetrics and Gynecology

## 2012-01-20 VITALS — BP 112/58 | Wt 223.0 lb

## 2012-01-20 DIAGNOSIS — Z331 Pregnant state, incidental: Secondary | ICD-10-CM

## 2012-01-20 NOTE — Progress Notes (Signed)
[redacted]w[redacted]d Sees is a physical therapist. Not sure it is helping. Return office in 2 weeks. Dr. Stefano Gaul

## 2012-01-20 NOTE — Progress Notes (Signed)
[redacted]w[redacted]d

## 2012-01-21 ENCOUNTER — Encounter: Payer: Medicaid Other | Admitting: Physical Therapy

## 2012-02-03 ENCOUNTER — Ambulatory Visit (INDEPENDENT_AMBULATORY_CARE_PROVIDER_SITE_OTHER): Payer: Medicaid Other | Admitting: Obstetrics and Gynecology

## 2012-02-03 VITALS — BP 104/62 | Wt 223.0 lb

## 2012-02-03 DIAGNOSIS — Z331 Pregnant state, incidental: Secondary | ICD-10-CM

## 2012-02-03 NOTE — Progress Notes (Signed)
Pt stated she is hurting a lot all over her body.

## 2012-02-03 NOTE — Progress Notes (Signed)
[redacted]w[redacted]d Gonorrhea, chlamydia, and beta strep sent Return to office in 1 week. Dr. Stefano Gaul

## 2012-02-04 LAB — GC/CHLAMYDIA PROBE AMP
CT Probe RNA: NEGATIVE
GC Probe RNA: NEGATIVE

## 2012-02-09 ENCOUNTER — Ambulatory Visit (INDEPENDENT_AMBULATORY_CARE_PROVIDER_SITE_OTHER): Payer: Medicaid Other | Admitting: Obstetrics and Gynecology

## 2012-02-09 VITALS — BP 108/72 | Wt 223.0 lb

## 2012-02-09 DIAGNOSIS — Z331 Pregnant state, incidental: Secondary | ICD-10-CM

## 2012-02-09 NOTE — Progress Notes (Signed)
[redacted]w[redacted]d  Pt desires cervix check today.  GBS Positive   Contractions last night were 7 mins apart, pt states they are getting more intense.

## 2012-02-09 NOTE — Progress Notes (Signed)
[redacted]w[redacted]d Positive beta strep.  GC negative.  Chlamydia negative. Return office in 1 week. Dr. Stefano Gaul

## 2012-02-13 ENCOUNTER — Encounter (HOSPITAL_COMMUNITY): Payer: Self-pay | Admitting: Emergency Medicine

## 2012-02-13 ENCOUNTER — Inpatient Hospital Stay (HOSPITAL_COMMUNITY)
Admission: AD | Admit: 2012-02-13 | Discharge: 2012-02-13 | Disposition: A | Discharge status: Home / Self Care | Payer: Medicaid Other | Source: Ambulatory Visit | Attending: Obstetrics and Gynecology | Admitting: Obstetrics and Gynecology

## 2012-02-13 ENCOUNTER — Emergency Department (HOSPITAL_COMMUNITY)
Admission: EM | Admit: 2012-02-13 | Discharge: 2012-02-13 | Disposition: A | Discharge status: Home / Self Care | Payer: Medicaid Other | Source: Home / Self Care | Attending: Emergency Medicine | Admitting: Emergency Medicine

## 2012-02-13 ENCOUNTER — Encounter (HOSPITAL_COMMUNITY): Payer: Self-pay | Admitting: *Deleted

## 2012-02-13 DIAGNOSIS — Y92009 Unspecified place in unspecified non-institutional (private) residence as the place of occurrence of the external cause: Secondary | ICD-10-CM

## 2012-02-13 DIAGNOSIS — S300XXA Contusion of lower back and pelvis, initial encounter: Secondary | ICD-10-CM

## 2012-02-13 DIAGNOSIS — W010XXA Fall on same level from slipping, tripping and stumbling without subsequent striking against object, initial encounter: Secondary | ICD-10-CM | POA: Insufficient documentation

## 2012-02-13 DIAGNOSIS — O99019 Anemia complicating pregnancy, unspecified trimester: Secondary | ICD-10-CM | POA: Insufficient documentation

## 2012-02-13 DIAGNOSIS — W19XXXA Unspecified fall, initial encounter: Secondary | ICD-10-CM

## 2012-02-13 DIAGNOSIS — D649 Anemia, unspecified: Secondary | ICD-10-CM | POA: Insufficient documentation

## 2012-02-13 DIAGNOSIS — O99891 Other specified diseases and conditions complicating pregnancy: Secondary | ICD-10-CM | POA: Insufficient documentation

## 2012-02-13 DIAGNOSIS — Z3490 Encounter for supervision of normal pregnancy, unspecified, unspecified trimester: Secondary | ICD-10-CM

## 2012-02-13 DIAGNOSIS — O2340 Unspecified infection of urinary tract in pregnancy, unspecified trimester: Secondary | ICD-10-CM

## 2012-02-13 LAB — CBC
MCV: 85.9 fL (ref 78.0–100.0)
Platelets: 315 10*3/uL (ref 150–400)
RBC: 3.41 MIL/uL — ABNORMAL LOW (ref 3.87–5.11)
RDW: 13.4 % (ref 11.5–15.5)
WBC: 13.3 10*3/uL — ABNORMAL HIGH (ref 4.0–10.5)

## 2012-02-13 MED ORDER — CYCLOBENZAPRINE HCL 10 MG PO TABS: 10.0000 mg | ORAL_TABLET | Freq: Two times a day (BID) | ORAL | Status: DC | PRN

## 2012-02-13 MED ORDER — CYCLOBENZAPRINE HCL 10 MG PO TABS
10.0000 mg | ORAL_TABLET | Freq: Once | ORAL | Status: AC
  Administered 2012-02-13: 10 mg via ORAL
  Filled 2012-02-13: qty 1

## 2012-02-13 MED ORDER — RHO D IMMUNE GLOBULIN 1500 UNIT/2ML IJ SOLN
300.0000 ug | Freq: Once | INTRAMUSCULAR | Status: AC
  Administered 2012-02-13: 300 ug via INTRAMUSCULAR

## 2012-02-13 NOTE — Progress Notes (Signed)
Dr. Stefano Gaul phoned notified of pt fall and EFM tracing of category I of FHR 140 with moderate variabililty, accels present and no decels. Reported no contractions at this time. Dr. Stefano Gaul stated pt needed to come to [facility] for further evaluation and assessment by him. And that pt could have husband drive her to [facility] once ED cleared pt.

## 2012-02-13 NOTE — Progress Notes (Signed)
[facility] ED called regarding pt who fell around 2-3 a.m. And was feeling dizzy at [redacted] weeks pregnant. OB RR RN in route

## 2012-02-13 NOTE — Progress Notes (Signed)
At bedside. Pt states she fell on "bottom" when up to the bathroom this morning between 2-3 a.m. Pt states she has felt fetal movement since fall, denies vaginal bleeding, or leaking of fluid. Pt denies feeling contractions but states she has back pain since fall. Abd soft nontender. Pt G3P1 at 37 weeks 4 days. Pt states she is seen by Dr. Stefano Gaul for prenatal care and was seen this week.

## 2012-02-13 NOTE — Progress Notes (Signed)
ED RN notified of Dr. Debria Garret request and source of transportation.

## 2012-02-13 NOTE — ED Notes (Addendum)
Upon further assessment pt reports she was dizzy yesterday at 0300 pt fell. Fell backwards, landed on butt, hips are sore 7/10. Denies water breaking. Pt was worried because her md told her "baby was in position for labor/ birth and wanted to make sure nothing was wrong with babys head".  First child born at 40 weeks.   Upon assessment pt not crowning nor has water broken.

## 2012-02-13 NOTE — ED Notes (Signed)
Pt is [redacted] weeks pregnant, pt states she began feeling dizzy last night, pt states it has improved some but has not gone away completely.  Pt goes to [facility] and could not find the phone number for them, pt called [facility] and was told to come here.  Pt denies any complications with this pregnancy, pt is G2 P1.  Pt denies abd pain, vaginal d/c, swelling, or any additional c/o.

## 2012-02-13 NOTE — Progress Notes (Signed)
Lupita Leash at [facility] notified of pt transport by private vehicle as soon as [facility] ED discharges patient.

## 2012-02-13 NOTE — ED Notes (Signed)
Rapid response OB GYN nurse called and on way

## 2012-02-13 NOTE — ED Notes (Signed)
Rapid response obgyn nurse at bedside

## 2012-02-13 NOTE — MAU Provider Note (Signed)
History   Katelyn Munoz is a 21y.o. WF at [redacted]w[redacted]d who presents from [facility] for extended monitoring s/p fall on her buttocks around 0130 this AM.  She was monitored for about 30 min there, and then given option to remain there and Dr. Stefano Gaul would go see her there, or come here to MAU for further eval.  Reports feeling nauseated during the night, and as she was getting up to go to [facility], she said her feet came out from under her, and she landed on her bottom on the floor.  No apparent injuries, lacerations, or abrasions.  No abdominal impact.  Denies ctxs or abdominal pain.  She has pain in her hips and low back which she rates a 7-8/10.  No VB or LOF.  GFM since then.  She did throw up one time overnight, and some nausea since, but has eaten breakfast and kept it down.  No recent illness.  No resp c/o's.  Accompanied by her s.o. And 2y.o. Daughter.  She reports she has not received Rhophylac this pregnancy.    .. Patient Active Problem List  Diagnosis  . Asthma  . Rh negative state in antepartum period  . ASCUS favor benign  . UTI (urinary tract infection) during pregnancy   CSN: [id number]  Arrival date and time: 02/13/12 1224   First Provider Initiated Contact with Patient 02/13/12 1334      Chief Complaint  Patient presents with  . Fall  . Back Pain   HPI  OB History    Grav Para Term Preterm Abortions TAB SAB Ect Mult Living   3 1 1  1  1   1      Obstetric Comments   2012 :PTL AT 54 WK; PIH;  MIGRAINES PP; +GBS; [id number]      Past Medical History  Diagnosis Date  . Hypertension   . Anginal pain AGE 53    OCC SX  . Pneumonia     AS CHILD  . Depression AGE 84    NO MEDS; NOT DIAGNOSED  . Anxiety AGE 84    NO MEDS;  NOT DIAGNOSED  . Pregnancy induced hypertension 2012  . Asthma     CHILDHOOD  . Preterm labor 2012  . Infection     UTI OCC  . Infection     YEAST X 1  . Shortness of breath     WITH ANXIETY  . Anemia     LONG TERM  . Fracture of wrist AGE 56  .  Fracture, finger AGE 56  . Fracture, ankle AGE 13  . Headache     MIGRAINES;  TYLENOL  . H/O varicella   . Abnormal Pap smear 09/07/2011  . Yeast infection   . Bacterial infection     Past Surgical History  Procedure Date  . Appendectomy AGE 27    Family History  Problem Relation Age of Onset  . Diabetes Paternal Grandmother   . Early death Paternal Grandmother 84  . Cancer Maternal Grandmother     cervical cancer   . Lung cancer Maternal Grandfather   . Heart disease Maternal Grandfather   . Parkinsonism Maternal Grandfather   . Depression Mother   . Bipolar disorder Mother   . Drug abuse Mother   . Alcohol abuse Mother   . Arthritis Maternal Aunt   . Hyperlipidemia Maternal Aunt   . Hypertension Maternal Aunt     History  Substance Use Topics  . Smoking status: Current Every Day Smoker --  0.5 packs/day for 7 years    Types: Cigarettes  . Smokeless tobacco: Never Used  . Alcohol Use: No     Comment: RARELY    Allergies:  Allergies  Allergen Reactions  . Tree Extract     Prescriptions prior to admission  Medication Sig Dispense Refill  . diphenhydrAMINE (BENADRYL) 25 mg capsule Take 50 mg by mouth at bedtime as needed. For sleep.      . Prenatal Vit-Fe Fumarate-FA (PRENATAL MULTIVITAMIN) TABS Take 1 tablet by mouth daily.         ROS--see HPI above Physical Exam   Blood pressure 110/93, pulse 109, temperature 98.3 F (36.8 C), temperature source Oral, resp. rate 18, height 5' 3.5" (1.613 m), weight 222 lb 6.4 oz (100.88 kg), last menstrual period 05/26/2011, SpO2 99.00%. .. Filed Vitals:   02/13/12 1248 02/13/12 1305 02/13/12 1624  BP: 116/74 110/93 123/54  Pulse: 103 109 90  Temp: 98.3 F (36.8 C)    TempSrc: Oral    Resp: 18  20  Height: 5' 3.5" (1.613 m)    Weight: 222 lb 6.4 oz (100.88 kg)    SpO2: 99%     Physical Exam  Constitutional: She is oriented to person, place, and time. She appears well-developed and well-nourished. No distress.    HENT:  Head: Normocephalic.  Eyes: Pupils are equal, round, and reactive to light.  Cardiovascular: Normal rate.   Respiratory: Effort normal.  GI: Soft.       gravid  Genitourinary:       Deferred pelvic exam  Neurological: She is alert and oriented to person, place, and time.  Skin: Skin is warm and dry.       Intact; no bruises or abrasions  .Marland Kitchen Results for orders placed during the hospital encounter of 02/13/12 (from the past 24 hour(s))  RH IG WORKUP (INCLUDES ABO/RH)     Status: Normal (Preliminary result)   Collection Time   02/13/12  2:13 PM      Component Value Range   Gestational Age(Wks) 37.4     ABO/RH(D) A NEG     Antibody Screen NEG     Fetal Screen NEG     Unit Number [id number]     Blood Component Type RHIG     Unit division 00     Status of Unit ISSUED     Transfusion Status OK TO TRANSFUSE    CBC     Status: Abnormal   Collection Time   02/13/12  2:13 PM      Component Value Range   WBC 13.3 (*) 4.0 - 10.5 K/uL   RBC 3.41 (*) 3.87 - 5.11 MIL/uL   Hemoglobin 9.9 (*) 12.0 - 15.0 g/dL   HCT 81.1 (*) 91.4 - 78.2 %   MCV 85.9  78.0 - 100.0 fL   MCH 29.0  26.0 - 34.0 pg   MCHC 33.8  30.0 - 36.0 g/dL   RDW 95.6  21.3 - 08.6 %   Platelets 315  150 - 400 K/uL    MAU Course  Procedures 1. Extended monitoring 2. Cbc 3. Rhopylac w/u and injection 4. Flexeril 10mg  po x1  Assessment and Plan  1. [redacted]w[redacted]d 2. S/p fall 3.  A neg and hasn't received Rhophylac this pregnancy 4.  No significant injuries but sore back and hips--some relief w/ flexeril 5. No s/s of labor 6.  Reactive and reassuring FHT 7. Anemia in pregnancy  1.  Following extended monitoring and c/w Dr.  Stringer, pt d/c'd home w/ fall and labor precautions and      FKC 2.  F/u 01/2612 at [facility] as scheduled or prn 3.  Comfort measures disc'd and also Rx'd Flexeril for prn use 4.  Need to discuss iron rich diet and give samples at Tuesday's visit  Maddi Collar H 02/13/2012, 2:59 PM

## 2012-02-13 NOTE — ED Notes (Signed)
Pt escorted to discharge window. Pt verbalized understanding discharge instructions. In no acute distress.  

## 2012-02-13 NOTE — ED Provider Notes (Signed)
History     CSN: [id number]  Arrival date & time 02/13/12  1034   First MD Initiated Contact with Patient 02/13/12 1110      Chief Complaint  Patient presents with  . pregnant, fall last night   . Fall  . Dizziness    (Consider location/radiation/quality/duration/timing/severity/associated sxs/prior treatment) HPI Pt is [redacted]wks pregnant with no complications. States she felt nauseated during the night and got up to go to the bathroom when she had an episode of dizziness and states she slipped falling onto her buttocks. Denies any abdominal injury or head injury. She is complaining of moderate aching low back pain since the fall. No abdominal pain, vaginal bleeding or gush of fluid. Baby has been moving normally. She did not call MAU or Ob this morning as noted in triage note. That was from a previous episode of braxton-hicks.   Past Medical History  Diagnosis Date  . Hypertension   . Anginal pain AGE 9    OCC SX  . Pneumonia     AS CHILD  . Depression AGE 88    NO MEDS; NOT DIAGNOSED  . Anxiety AGE 88    NO MEDS;  NOT DIAGNOSED  . Pregnancy induced hypertension 2012  . Asthma     CHILDHOOD  . Preterm labor 2012  . Infection     UTI OCC  . Infection     YEAST X 1  . Shortness of breath     WITH ANXIETY  . Anemia     LONG TERM  . Fracture of wrist AGE 6  . Fracture, finger AGE 6  . Fracture, ankle AGE 45  . Headache     MIGRAINES;  TYLENOL  . H/O varicella   . Abnormal Pap smear 09/07/2011  . Yeast infection   . Bacterial infection     Past Surgical History  Procedure Date  . Appendectomy AGE 77    Family History  Problem Relation Age of Onset  . Diabetes Paternal Grandmother   . Early death Paternal Grandmother 9  . Cancer Maternal Grandmother     cervical cancer   . Lung cancer Maternal Grandfather   . Heart disease Maternal Grandfather   . Parkinsonism Maternal Grandfather   . Depression Mother   . Bipolar disorder Mother   . Drug abuse Mother     . Alcohol abuse Mother   . Arthritis Maternal Aunt   . Hyperlipidemia Maternal Aunt   . Hypertension Maternal Aunt     History  Substance Use Topics  . Smoking status: Current Every Day Smoker -- 0.5 packs/day for 7 years    Types: Cigarettes  . Smokeless tobacco: Never Used  . Alcohol Use: No     Comment: RARELY    OB History    Grav Para Term Preterm Abortions TAB SAB Ect Mult Living   3 1 1  1  1   1      Obstetric Comments   2012 :PTL AT 46 WK; PIH;  MIGRAINES PP; +GBS; [id number]      Review of Systems All other systems reviewed and are negative except as noted in HPI.   Allergies  Tree extract  Home Medications   Current Outpatient Rx  Name  Route  Sig  Dispense  Refill  . CEPHALEXIN 500 MG PO CAPS   Oral   Take 500 mg by mouth 4 (four) times daily. DID  NOT COMPLETE REGIME         . CYCLOBENZAPRINE  HCL 5 MG PO TABS   Oral   Take 5 mg by mouth 3 (three) times daily as needed.         Marland Kitchen PRENATAL MULTIVITAMIN CH   Oral   Take 1 tablet by mouth daily. PRN           BP 121/68  Pulse 115  Temp 98 F (36.7 C) (Oral)  Resp 18  SpO2 99%  LMP 05/26/2011  Physical Exam  Nursing note and vitals reviewed. Constitutional: She is oriented to person, place, and time. She appears well-developed and well-nourished.  HENT:  Head: Normocephalic and atraumatic.  Eyes: EOM are normal. Pupils are equal, round, and reactive to light.  Neck: Normal range of motion. Neck supple.  Cardiovascular: Normal rate, normal heart sounds and intact distal pulses.   Pulmonary/Chest: Effort normal and breath sounds normal.  Abdominal: Bowel sounds are normal. She exhibits no distension. There is no tenderness. There is no rebound and no guarding.       Gravid  Musculoskeletal: Normal range of motion. She exhibits tenderness (R lumbar paraspinal tenderness, no bony tenderness). She exhibits no edema.  Neurological: She is alert and oriented to person, place, and time. She  has normal strength. No cranial nerve deficit or sensory deficit.  Skin: Skin is warm and dry. No rash noted.  Psychiatric: She has a normal mood and affect.    ED Course  Procedures (including critical care time)  Labs Reviewed - No data to display No results found.   No diagnosis found.    MDM  Rapid OB nurse at bedside to begin tocometry and fetal heart monitor.    12:03 PM Per OB, [facility]. Cleared from our stand-point. Discharged to go directly to MAU.     Charles B. Bernette Mayers, MD 02/13/12 [phone]

## 2012-02-13 NOTE — MAU Note (Signed)
Patient states at 0200 she became nauseated and ran to the bathroom and slipped on water and fell on her buttocks. Went to [facility] for evaluation and then sent to MAU for monitoring. States she has had lower back pain that has been getting worse. Denies contractions, leaking or bleeding and reports good fetal movement.

## 2012-02-14 DIAGNOSIS — Y92009 Unspecified place in unspecified non-institutional (private) residence as the place of occurrence of the external cause: Secondary | ICD-10-CM

## 2012-02-14 DIAGNOSIS — O99013 Anemia complicating pregnancy, third trimester: Secondary | ICD-10-CM | POA: Insufficient documentation

## 2012-02-14 LAB — RH IG WORKUP (INCLUDES ABO/RH)
Antibody Screen: NEGATIVE
Unit division: 0

## 2012-02-17 ENCOUNTER — Ambulatory Visit (INDEPENDENT_AMBULATORY_CARE_PROVIDER_SITE_OTHER): Payer: Medicaid Other | Admitting: Obstetrics and Gynecology

## 2012-02-17 VITALS — BP 110/78 | Wt 222.0 lb

## 2012-02-17 DIAGNOSIS — Z331 Pregnant state, incidental: Secondary | ICD-10-CM

## 2012-02-17 NOTE — Progress Notes (Signed)
[redacted]w[redacted]d  Pt desires Cervix check today Pt c/o head cold her husband states she has a strong hard cough that even makes her nauseas

## 2012-02-17 NOTE — Progress Notes (Signed)
[redacted]w[redacted]d Tired and ready for the baby. Rhonchi heard in both lungs. Sudafed, Robitussin, and Tylenol as needed. Return office in 1 week. Dr. Stefano Gaul

## 2012-02-24 ENCOUNTER — Ambulatory Visit (INDEPENDENT_AMBULATORY_CARE_PROVIDER_SITE_OTHER): Payer: Medicaid Other | Admitting: Obstetrics and Gynecology

## 2012-02-24 VITALS — BP 110/60 | Wt 221.0 lb

## 2012-02-24 DIAGNOSIS — Z331 Pregnant state, incidental: Secondary | ICD-10-CM

## 2012-02-24 MED ORDER — NYSTATIN-TRIAMCINOLONE 100000-0.1 UNIT/GM-% EX OINT: TOPICAL_OINTMENT | Freq: Two times a day (BID) | CUTANEOUS | Status: DC

## 2012-02-24 NOTE — Progress Notes (Signed)
[redacted]w[redacted]d No concerns today

## 2012-02-24 NOTE — Progress Notes (Signed)
[redacted]w[redacted]d Doing well Cervix unchanged, membranes swept Vulva very excoriated and erythematous, pt denies any itching or irritation  Will give rx for mycolog  Pt wants induction at 40wks, will send schedule request  rv'd r/b  rv'd [facility]KC and labor sx's

## 2012-02-25 ENCOUNTER — Telehealth: Payer: Self-pay | Admitting: Obstetrics and Gynecology

## 2012-02-25 ENCOUNTER — Encounter (HOSPITAL_COMMUNITY): Payer: Self-pay | Admitting: *Deleted

## 2012-02-25 ENCOUNTER — Telehealth (HOSPITAL_COMMUNITY): Payer: Self-pay | Admitting: *Deleted

## 2012-02-25 NOTE — Telephone Encounter (Signed)
Induction scheduled for 03/01/12 @ 7:30pm with CHS/SR. -[name]

## 2012-02-25 NOTE — Telephone Encounter (Signed)
Preadmission screen  

## 2012-03-01 ENCOUNTER — Encounter (HOSPITAL_COMMUNITY): Payer: Self-pay

## 2012-03-01 ENCOUNTER — Inpatient Hospital Stay (HOSPITAL_COMMUNITY)
Admission: RE | Admit: 2012-03-01 | Discharge: 2012-03-03 | DRG: 775 | Disposition: A | Discharge status: Home / Self Care | Payer: Medicaid Other | Source: Ambulatory Visit | Attending: Obstetrics and Gynecology | Admitting: Obstetrics and Gynecology

## 2012-03-01 DIAGNOSIS — Z22330 Carrier of Group B streptococcus: Secondary | ICD-10-CM

## 2012-03-01 DIAGNOSIS — O26899 Other specified pregnancy related conditions, unspecified trimester: Secondary | ICD-10-CM | POA: Diagnosis present

## 2012-03-01 DIAGNOSIS — O9903 Anemia complicating the puerperium: Secondary | ICD-10-CM | POA: Diagnosis not present

## 2012-03-01 DIAGNOSIS — Z6791 Unspecified blood type, Rh negative: Secondary | ICD-10-CM | POA: Diagnosis present

## 2012-03-01 DIAGNOSIS — O99820 Streptococcus B carrier state complicating pregnancy: Secondary | ICD-10-CM

## 2012-03-01 DIAGNOSIS — D649 Anemia, unspecified: Secondary | ICD-10-CM

## 2012-03-01 DIAGNOSIS — O480 Post-term pregnancy: Principal | ICD-10-CM | POA: Diagnosis present

## 2012-03-01 DIAGNOSIS — O99892 Other specified diseases and conditions complicating childbirth: Secondary | ICD-10-CM | POA: Diagnosis present

## 2012-03-01 DIAGNOSIS — E669 Obesity, unspecified: Secondary | ICD-10-CM | POA: Diagnosis present

## 2012-03-01 LAB — CBC
HCT: 31.6 % — ABNORMAL LOW (ref 36.0–46.0)
MCH: 28.9 pg (ref 26.0–34.0)
MCHC: 34.2 g/dL (ref 30.0–36.0)
MCV: 84.5 fL (ref 78.0–100.0)
RDW: 13.5 % (ref 11.5–15.5)

## 2012-03-01 MED ORDER — PHENYLEPHRINE 40 MCG/ML (10ML) SYRINGE FOR IV PUSH (FOR BLOOD PRESSURE SUPPORT)
80.0000 ug | PREFILLED_SYRINGE | INTRAVENOUS | Status: DC | PRN
  Filled 2012-03-01: qty 5

## 2012-03-01 MED ORDER — SODIUM CHLORIDE 0.9 % IJ SOLN: 3.0000 mL | Freq: Two times a day (BID) | INTRAMUSCULAR | Status: DC

## 2012-03-01 MED ORDER — LACTATED RINGERS IV SOLN: 500.0000 mL | INTRAVENOUS | Status: DC | PRN

## 2012-03-01 MED ORDER — FENTANYL CITRATE 0.05 MG/ML IJ SOLN: 100.0000 ug | INTRAMUSCULAR | Status: DC | PRN

## 2012-03-01 MED ORDER — PHENYLEPHRINE 40 MCG/ML (10ML) SYRINGE FOR IV PUSH (FOR BLOOD PRESSURE SUPPORT): 80.0000 ug | PREFILLED_SYRINGE | INTRAVENOUS | Status: DC | PRN

## 2012-03-01 MED ORDER — EPHEDRINE 5 MG/ML INJ
10.0000 mg | INTRAVENOUS | Status: DC | PRN
  Filled 2012-03-01: qty 4

## 2012-03-01 MED ORDER — DIPHENHYDRAMINE HCL 50 MG/ML IJ SOLN: 12.5000 mg | INTRAMUSCULAR | Status: DC | PRN

## 2012-03-01 MED ORDER — OXYTOCIN 40 UNITS IN LACTATED RINGERS INFUSION - SIMPLE MED
1.0000 m[IU]/min | INTRAVENOUS | Status: DC
  Administered 2012-03-02: 1 m[IU]/min via INTRAVENOUS

## 2012-03-01 MED ORDER — LIDOCAINE HCL (PF) 1 % IJ SOLN
30.0000 mL | INTRAMUSCULAR | Status: DC | PRN
  Filled 2012-03-01: qty 30

## 2012-03-01 MED ORDER — SODIUM CHLORIDE 0.9 % IV SOLN: 250.0000 mL | INTRAVENOUS | Status: DC | PRN

## 2012-03-01 MED ORDER — HYDROXYZINE HCL 50 MG/ML IM SOLN: 50.0000 mg | Freq: Four times a day (QID) | INTRAMUSCULAR | Status: DC | PRN

## 2012-03-01 MED ORDER — TERBUTALINE SULFATE 1 MG/ML IJ SOLN: 0.2500 mg | Freq: Once | INTRAMUSCULAR | Status: AC | PRN

## 2012-03-01 MED ORDER — LACTATED RINGERS IV SOLN
INTRAVENOUS | Status: DC
  Administered 2012-03-02: 10:00:00 via INTRAVENOUS

## 2012-03-01 MED ORDER — SODIUM CHLORIDE 0.9 % IJ SOLN: 3.0000 mL | INTRAMUSCULAR | Status: DC | PRN

## 2012-03-01 MED ORDER — CITRIC ACID-SODIUM CITRATE 334-500 MG/5ML PO SOLN: 30.0000 mL | ORAL | Status: DC | PRN

## 2012-03-01 MED ORDER — ONDANSETRON HCL 4 MG/2ML IJ SOLN: 4.0000 mg | Freq: Four times a day (QID) | INTRAMUSCULAR | Status: DC | PRN

## 2012-03-01 MED ORDER — HYDROXYZINE HCL 50 MG PO TABS: 50.0000 mg | ORAL_TABLET | Freq: Four times a day (QID) | ORAL | Status: DC | PRN

## 2012-03-01 MED ORDER — IBUPROFEN 600 MG PO TABS: 600.0000 mg | ORAL_TABLET | Freq: Four times a day (QID) | ORAL | Status: DC | PRN

## 2012-03-01 MED ORDER — OXYTOCIN 40 UNITS IN LACTATED RINGERS INFUSION - SIMPLE MED
62.5000 mL/h | INTRAVENOUS | Status: DC
  Filled 2012-03-01: qty 1000

## 2012-03-01 MED ORDER — ZOLPIDEM TARTRATE 5 MG PO TABS
5.0000 mg | ORAL_TABLET | Freq: Every evening | ORAL | Status: DC | PRN
  Administered 2012-03-01: 5 mg via ORAL
  Filled 2012-03-01: qty 1

## 2012-03-01 MED ORDER — MISOPROSTOL 25 MCG QUARTER TABLET
25.0000 ug | ORAL_TABLET | ORAL | Status: DC | PRN
  Administered 2012-03-01 – 2012-03-02 (×2): 25 ug via VAGINAL
  Filled 2012-03-01 (×3): qty 0.25

## 2012-03-01 MED ORDER — ACETAMINOPHEN 325 MG PO TABS: 650.0000 mg | ORAL_TABLET | ORAL | Status: DC | PRN

## 2012-03-01 MED ORDER — FENTANYL 2.5 MCG/ML BUPIVACAINE 1/10 % EPIDURAL INFUSION (WH - ANES)
14.0000 mL/h | INTRAMUSCULAR | Status: DC
  Administered 2012-03-02: 14 mL/h via EPIDURAL
  Filled 2012-03-01: qty 125

## 2012-03-01 MED ORDER — OXYCODONE-ACETAMINOPHEN 5-325 MG PO TABS: 1.0000 | ORAL_TABLET | ORAL | Status: DC | PRN

## 2012-03-01 MED ORDER — OXYTOCIN BOLUS FROM INFUSION
500.0000 mL | INTRAVENOUS | Status: DC
  Administered 2012-03-02: 500 mL via INTRAVENOUS

## 2012-03-01 MED ORDER — EPHEDRINE 5 MG/ML INJ: 10.0000 mg | INTRAVENOUS | Status: DC | PRN

## 2012-03-01 MED ORDER — LACTATED RINGERS IV SOLN
500.0000 mL | Freq: Once | INTRAVENOUS | Status: AC
  Administered 2012-03-02: 500 mL via INTRAVENOUS

## 2012-03-01 NOTE — H&P (Signed)
Katelyn Munoz is a 21 y.o.obese white female presenting for term elective induction of labor.  Reports GFM.  No LOF or VB.  Occ'l ctxs.  cx 3cm/50% in office last week.  No recent fever; has had congestion/cough.  No GI c/o's. No UTI or PIH s/s.  Accompanied by her husband and her daughter.  Prenatal Course: Pt started care at [facility] at [redacted]w[redacted]d.  Had earlier u/s around 11weeks which confirmed EDC.  Normal anatomy scan and Quad screen.  Treated for UTI 3x in pregnancy.  Pt struggled w/ low back pain during pregnancy and did see Physical therapist.  Also struggled w/ fluctuating emotions, feeling sad, tired; referred to [facility] but pt did not keep appt.  At 24 weeks c/o congestion and sore throat, and again started having same symptoms about 2 weeks ago.  Pt fell 02/17/12 at home on her buttocks and had extended monitoring in MAU; she received Rhophylac that day, and that was first time during pregnancy.    OB Hx: G1=SVD  1/'12 At 40 weeks in [place] F=7+14 (72 hr labor G2=SAB 2/'13 at 8 weeks G3=current  .Marland Kitchen Patient Active Problem List  Diagnosis  . Asthma  . Rh negative state in antepartum period  . ASCUS favor benign  . UTI (urinary tract infection) during pregnancy  . Fall at home  . Anemia complicating pregnancy in third trimester  . Obese  . Post-term pregnancy, 40-42 weeks of gestation   Maternal Medical History:  Contractions: Frequency: irregular.   Perceived severity is mild.    Fetal activity: Perceived fetal activity is normal.   Last perceived fetal movement was within the past hour.      OB History    Grav Para Term Preterm Abortions TAB SAB Ect Mult Living   3 1 1  1  1   1      Obstetric Comments   2012 :PTL AT 63 WK; PIH;  MIGRAINES PP; +GBS; [id number]     Past Medical History  Diagnosis Date  . Hypertension   . Anginal pain AGE 21    OCC SX  . Pneumonia     AS CHILD  . Depression AGE 65    NO MEDS; NOT DIAGNOSED  . Anxiety AGE 65    NO MEDS;  NOT DIAGNOSED   . Pregnancy induced hypertension 2012  . Preterm labor 2012  . Infection     UTI OCC  . Infection     YEAST X 1  . Shortness of breath     WITH ANXIETY  . Anemia     LONG TERM  . Fracture of wrist AGE 29  . Fracture, finger AGE 29  . Fracture, ankle AGE 89  . Headache     MIGRAINES;  TYLENOL  . H/O varicella   . Abnormal Pap smear 09/07/2011  . Yeast infection   . Bacterial infection   . Asthma     CHILDHOOD   Past Surgical History  Procedure Date  . Appendectomy AGE 14   Family History: family history includes Alcohol abuse in her father and mother; Arthritis in her father and maternal aunt; Bipolar disorder in her mother; Cancer in her maternal grandmother; Depression in her mother; Diabetes in her paternal grandmother; Drug abuse in her mother; Early death (age of onset:48) in her paternal grandmother; Heart disease in her maternal grandfather; Hyperlipidemia in her maternal aunt; Hypertension in her father and maternal aunt; Lung cancer in her maternal grandfather; and Parkinsonism in her maternal grandfather.  Social History:  reports that she has been smoking Cigarettes.  She has a 3.5 pack-year smoking history. She has never used smokeless tobacco. She reports that she does not drink alcohol or use illicit drugs.   Prenatal Transfer Tool  Maternal Diabetes: No Genetic Screening: Normal Maternal Ultrasounds/Referrals: Normal Fetal Ultrasounds or other Referrals:  None Maternal Substance Abuse:  No Significant Maternal Medications:  None Significant Maternal Lab Results:  Lab values include: Group B Strep negative, Rh negative Other Comments:  None  Review of Systems  Constitutional: Negative.   HENT: Positive for congestion.   Eyes: Negative.   Respiratory: Positive for cough.   Gastrointestinal: Negative.   Genitourinary: Negative.   Skin: Negative.   Neurological: Negative.     Dilation: 2.5 Effacement (%): 60 Station: -3 Exam by:: h Avrohom Mckelvin cnm Blood  pressure 117/71, pulse 103, temperature 98 F (36.7 C), temperature source Oral, resp. rate 20, height 5\' 3"  (1.6 m), weight 221 lb (100.245 kg), last menstrual period 05/26/2011. Maternal Exam:  Uterine Assessment: Contraction strength is mild.  Abdomen: Patient reports no abdominal tenderness. Fetal presentation: vertex  Introitus: Vulva is positive for vulval edema. Pelvis: adequate for delivery.   Cervix: Cervix evaluated by digital exam.     Fetal Exam Fetal Monitor Review: Mode: ultrasound.   Baseline rate: 145.  Variability: moderate (6-25 bpm).   Pattern: accelerations present and no decelerations.    Fetal State Assessment: Category I - tracings are normal.     Physical Exam  Constitutional: She is oriented to person, place, and time. She appears well-developed and well-nourished. No distress.  HENT:  Head: Normocephalic and atraumatic.  Eyes: Pupils are equal, round, and reactive to light.  Cardiovascular: Normal rate.   Respiratory: Effort normal.  GI: Soft.       gravid  Musculoskeletal: She exhibits edema.       1+ BLE edema, some pitting on shins  Neurological: She is alert and oriented to person, place, and time. She has normal reflexes.  Skin: Skin is warm and dry.  Psychiatric: She has a normal mood and affect. Her behavior is normal. Judgment and thought content normal.    Prenatal labs: ABO, Rh: --/--/A NEG (11/22 1413) Antibody: NEG (11/22 1413) Rubella: 50.0 (07/16 1435) RPR: NON REAC (09/19 1143)  HBsAg: NEGATIVE (07/16 1435)  HIV: NON REACTIVE (07/16 1435)  GBS: POSITIVE (11/12 1425)  1hr gtt=108 Pap 08/28/11 ASCUS w/o HR HPV Assessment/Plan: 1. 40 weeks 2. Term elective induction 3. Cat I FHT 4. GBS pos 5.  Recent URI 6. Anemia in pregnancy 7. Rh neg  1. Admit to [facility] w/ Dr. Estanislado Pandy as attending 2. Routine L&D orders 3. cytotec for ripening and Pitocin in AM 4.  PCN w/ ROM or active labor 5.  Anticipate SVD 6. C/w MD prn     Katelyn Munoz H 03/01/2012, 8:30 PM

## 2012-03-01 NOTE — Progress Notes (Signed)
Spoke with steelman cnm , pt may come off monitor at 2200, may do intermittent monitoring.

## 2012-03-02 ENCOUNTER — Encounter (HOSPITAL_COMMUNITY): Payer: Self-pay | Admitting: Anesthesiology

## 2012-03-02 ENCOUNTER — Inpatient Hospital Stay (HOSPITAL_COMMUNITY): Payer: Medicaid Other | Admitting: Anesthesiology

## 2012-03-02 ENCOUNTER — Encounter (HOSPITAL_COMMUNITY): Payer: Self-pay

## 2012-03-02 DIAGNOSIS — O480 Post-term pregnancy: Secondary | ICD-10-CM | POA: Diagnosis present

## 2012-03-02 DIAGNOSIS — O99820 Streptococcus B carrier state complicating pregnancy: Secondary | ICD-10-CM

## 2012-03-02 LAB — TYPE AND SCREEN: Antibody Screen: POSITIVE

## 2012-03-02 MED ORDER — BENZOCAINE-MENTHOL 20-0.5 % EX AERO: 1.0000 "application " | INHALATION_SPRAY | CUTANEOUS | Status: DC | PRN

## 2012-03-02 MED ORDER — MEASLES, MUMPS & RUBELLA VAC ~~LOC~~ INJ
0.5000 mL | INJECTION | Freq: Once | SUBCUTANEOUS | Status: DC
  Filled 2012-03-02: qty 0.5

## 2012-03-02 MED ORDER — FLEET ENEMA 7-19 GM/118ML RE ENEM: 1.0000 | ENEMA | Freq: Every day | RECTAL | Status: DC | PRN

## 2012-03-02 MED ORDER — SIMETHICONE 80 MG PO CHEW: 80.0000 mg | CHEWABLE_TABLET | ORAL | Status: DC | PRN

## 2012-03-02 MED ORDER — DIPHENHYDRAMINE HCL 25 MG PO CAPS
25.0000 mg | ORAL_CAPSULE | Freq: Four times a day (QID) | ORAL | Status: DC | PRN
  Administered 2012-03-02: 25 mg via ORAL
  Filled 2012-03-02: qty 1

## 2012-03-02 MED ORDER — PENICILLIN G POTASSIUM 5000000 UNITS IJ SOLR
5.0000 10*6.[IU] | Freq: Once | INTRAVENOUS | Status: AC
  Administered 2012-03-02: 5 10*6.[IU] via INTRAVENOUS
  Filled 2012-03-02: qty 5

## 2012-03-02 MED ORDER — LANOLIN HYDROUS EX OINT: TOPICAL_OINTMENT | CUTANEOUS | Status: DC | PRN

## 2012-03-02 MED ORDER — TETANUS-DIPHTH-ACELL PERTUSSIS 5-2.5-18.5 LF-MCG/0.5 IM SUSP: 0.5000 mL | Freq: Once | INTRAMUSCULAR | Status: DC

## 2012-03-02 MED ORDER — LIDOCAINE HCL (PF) 1 % IJ SOLN
INTRAMUSCULAR | Status: DC | PRN
  Administered 2012-03-02: 5 mL
  Administered 2012-03-02: 30 mL
  Administered 2012-03-02: 5 mL

## 2012-03-02 MED ORDER — SENNOSIDES-DOCUSATE SODIUM 8.6-50 MG PO TABS
2.0000 | ORAL_TABLET | Freq: Every day | ORAL | Status: DC
  Administered 2012-03-02: 2 via ORAL

## 2012-03-02 MED ORDER — ONDANSETRON HCL 4 MG/2ML IJ SOLN: 4.0000 mg | INTRAMUSCULAR | Status: DC | PRN

## 2012-03-02 MED ORDER — IBUPROFEN 600 MG PO TABS
600.0000 mg | ORAL_TABLET | Freq: Four times a day (QID) | ORAL | Status: DC
  Administered 2012-03-02 – 2012-03-03 (×4): 600 mg via ORAL
  Filled 2012-03-02 (×3): qty 1

## 2012-03-02 MED ORDER — WITCH HAZEL-GLYCERIN EX PADS: 1.0000 "application " | MEDICATED_PAD | CUTANEOUS | Status: DC | PRN

## 2012-03-02 MED ORDER — PRENATAL MULTIVITAMIN CH
1.0000 | ORAL_TABLET | Freq: Every day | ORAL | Status: DC
  Administered 2012-03-03: 1 via ORAL
  Filled 2012-03-02: qty 1

## 2012-03-02 MED ORDER — OXYCODONE-ACETAMINOPHEN 5-325 MG PO TABS: 1.0000 | ORAL_TABLET | ORAL | Status: DC | PRN

## 2012-03-02 MED ORDER — BISACODYL 10 MG RE SUPP: 10.0000 mg | Freq: Every day | RECTAL | Status: DC | PRN

## 2012-03-02 MED ORDER — ONDANSETRON HCL 4 MG PO TABS: 4.0000 mg | ORAL_TABLET | ORAL | Status: DC | PRN

## 2012-03-02 MED ORDER — DIPHENHYDRAMINE HCL 25 MG PO CAPS: 25.0000 mg | ORAL_CAPSULE | Freq: Four times a day (QID) | ORAL | Status: DC | PRN

## 2012-03-02 MED ORDER — PENICILLIN G POTASSIUM 5000000 UNITS IJ SOLR
2.5000 10*6.[IU] | INTRAMUSCULAR | Status: DC
  Administered 2012-03-02: 2.5 10*6.[IU] via INTRAVENOUS
  Filled 2012-03-02 (×5): qty 2.5

## 2012-03-02 MED ORDER — DIBUCAINE 1 % RE OINT: 1.0000 "application " | TOPICAL_OINTMENT | RECTAL | Status: DC | PRN

## 2012-03-02 MED ORDER — ZOLPIDEM TARTRATE 5 MG PO TABS: 5.0000 mg | ORAL_TABLET | Freq: Every evening | ORAL | Status: DC | PRN

## 2012-03-02 NOTE — Progress Notes (Signed)
Subjective: Pt had to be awoken to put on external monitors at 0030.  Resting well s/p Ambien.  First cytotec was at 2100.  Husband asleep at bedside.  Objective: BP 109/73  Pulse 115  Temp 98 F (36.7 C) (Oral)  Resp 20  Ht 5\' 3"  (1.6 m)  Wt 221 lb (100.245 kg)  BMI 39.15 kg/m2  LMP 05/26/2011      FHT:  FHR: 125 bpm, variability: moderate,  accelerations:  Present,  decelerations:  Absent UC:   regular, every 1.5-5 minutes; couplet pattern at times and some tachysystole SVE:   Dilation: 2.5 Effacement (%): 60 Station: -3 Exam by:: H Mckinley Olheiser CNM Deferred cervical exam at present b/c ctx pattern too frequent to proceed w/ next cytotec dose Labs: Lab Results  Component Value Date   WBC 15.4* 03/01/2012   HGB 10.8* 03/01/2012   HCT 31.6* 03/01/2012   MCV 84.5 03/01/2012   PLT 363 03/01/2012    Assessment / Plan: 1. [redacted]w[redacted]d 2. elective term induction 3. GBS pos  Labor: ripening in progress Preeclampsia:  no signs or symptoms of toxicity Fetal Wellbeing:  Category I Pain Control:  Labor support without medications I/D:  n/a Anticipated MOD:  NSVD 1. Will assess ctx pattern over net 1-2 hours to await spacing for next dose, or switch to Pitocin 2. C/w MD prn  Thayne Cindric H 03/02/2012, 1:22 AM

## 2012-03-02 NOTE — Progress Notes (Signed)
Hilary cnm on unit, reviewed tracing, pt contractioning to much for another cytotec, to go ahead and take off monitor. To put pt back on monitor at 3am to re evaluate  uc pattern.

## 2012-03-02 NOTE — Progress Notes (Signed)
Patient ID: Katelyn Munoz, female   DOB: May 29, 1990, 21 y.o.   MRN: [id number] Pt is 21 y.o. and at [redacted]w[redacted]d   .Subjective:  Wants breakfast, was asleep   Objective: BP 118/53  Pulse 72  Temp 97.9 F (36.6 C) (Oral)  Resp 20  Ht 5\' 3"  (1.6 m)  Wt 221 lb (284.132 kg)  BMI 39.15 kg/m2  LMP 05/26/2011   FHT:  FHR: 140 bpm, variability: moderate,  accelerations:  Abscent,  decelerations:  Absent UC:   irregular, every 2-4 minutes SVE:   Dilation: 3 Effacement (%): 70 Station: -3 Exam by:: Steelman CNM  ve deferred at present   Assessment / Plan: IOL, elective for PD   S/p 2 doses of cytotec last dose at 0415  Fetal Wellbeing:  Category I Pain Control:  Epidural when pt desires  Plan:  Will assess cervix after breakfast  Pen G ordered for GBS prophylaxis   Start pitocin per protocol after pt eats   Update physician PRN  Malissa Hippo 03/02/2012, 8:08 AM

## 2012-03-02 NOTE — Progress Notes (Signed)
Subjective: Pt awake, but doesn't discern ctxs unless on her back.  She ambulated after monitors removed around 0130.  Feels some cramping.  Wants to go to sleep and is hungry--didn't eat supper last night before coming to hospital.  Objective: BP 115/61  Pulse 90  Temp 98 F (36.7 C) (Oral)  Resp 20  Ht 5\' 3"  (1.6 m)  Wt 221 lb (100.245 kg)  BMI 39.15 kg/m2  LMP 05/26/2011      FHT:  FHR: 125 bpm, variability: moderate,  accelerations:  Present,  decelerations:  Present occ'l mild variable UC:   irregular, every 2-5 minutes SVE:   Dilation: 3 Effacement (%): 70 Station: -3 Exam by:: Silvia Hightower CNM Remains anterior behind symphysis and to pt's Lt Labs: Lab Results  Component Value Date   WBC 15.4* 03/01/2012   HGB 10.8* 03/01/2012   HCT 31.6* 03/01/2012   MCV 84.5 03/01/2012   PLT 363 03/01/2012    Assessment / Plan: 1. [redacted]w[redacted]d 2. elective IOL 3. GBS pos  Labor: s/p 2nd dose of cytotec Preeclampsia:  no signs or symptoms of toxicity Fetal Wellbeing:  Category I Pain Control:  Labor support without medications I/D:  n/a Anticipated MOD:  NSVD 1. Placed 2nd dose of cytotec and will give pt 25mg  po Benadryl now. 2. CTO closely and c/w MD prn Dewana Ammirati H 03/02/2012, 4:25 AM

## 2012-03-02 NOTE — Progress Notes (Addendum)
Patient ID: Katelyn Munoz, female   DOB: [birth year], 20 y.o.   MRN: [id number] Pt is 21 y.o. and at [redacted]w[redacted]d   .Subjective: Comfortable w epidural    Objective: BP 115/74  Pulse 81  Temp 97.2 F (36.2 C) (Oral)  Resp 20  Ht 5\' 3"  (1.6 m)  Wt 221 lb (100.245 kg)  BMI 39.15 kg/m2  SpO2 100%  LMP 05/26/2011   FHT:  FHR: 130 bpm, variability: moderate,  accelerations:  Present,  decelerations:  Present run of late decels after epidural, now occ variable UC:   regular, every 2 minutes SVE:   Dilation: 4 Effacement (%): 80 Station: -2 Exam by:: Sowder,rNc  AROM'd copious amt clear fluid IUPC placed without difficulty   Assessment / Plan: IOL for PD Early labor GBS pos - rcv'd PCN    Fetal Wellbeing:  Category I and Category II Pain Control:  Epidural  Plan:    Continue pitocin for titration adequate MVU's   Update physician PRN  Collyn Selk M 03/02/2012, 11:50 AM

## 2012-03-02 NOTE — Progress Notes (Signed)
Katelyn Munoz is a 21 y.o. G3P1011 at [redacted]w[redacted]d  Subjective: Feels pressure with contractions.    Objective: BP 116/63  Pulse 74  Temp 97.2 F (36.2 C) (Oral)  Resp 20  Ht 5\' 3"  (1.6 m)  Wt 221 lb (100.245 kg)  BMI 39.15 kg/m2  SpO2 100%  LMP 05/26/2011      FHT:  Cat 2 now UC:   regular, every 2-3 minutes SVE:   Dilation: 10 Effacement (%): 100 Station: +1 Exam by:: Sowder,RNC  Agree with exam.  Pt asked to push and no rapid descent.  Labs: Lab Results  Component Value Date   WBC 15.4* 03/01/2012   HGB 10.8* 03/01/2012   HCT 31.6* 03/01/2012   MCV 84.5 03/01/2012   PLT 363 03/01/2012    Assessment / Plan: P1 at 40wks in labor.  Will allow to labor down for now.  FHT decels have resolved with position change.  Fetal status is overall reassuring.  Asked by ND to standby for possible delivery (she is currently doing minor case in OR and CNM is in another delivery).   Katelyn Munoz Y 03/02/2012, 1:16 PM

## 2012-03-02 NOTE — Anesthesia Procedure Notes (Signed)
Epidural Patient location during procedure: OB Start time: 03/02/2012 9:57 AM  Staffing Anesthesiologist: Brayton Caves R Performed by: anesthesiologist   Preanesthetic Checklist Completed: patient identified, site marked, surgical consent, pre-op evaluation, timeout performed, IV checked, risks and benefits discussed and monitors and equipment checked  Epidural Patient position: sitting Prep: site prepped and draped and DuraPrep Patient monitoring: continuous pulse ox and blood pressure Approach: midline Injection technique: LOR air and LOR saline  Needle:  Needle type: Tuohy  Needle gauge: 17 G Needle length: 9 cm and 9 Needle insertion depth: 7 cm Catheter type: closed end flexible Catheter size: 19 Gauge Catheter at skin depth: 12 cm Test dose: negative  Assessment Events: blood not aspirated, injection not painful, no injection resistance, negative IV test and no paresthesia  Additional Notes Patient identified.  Risk benefits discussed including failed block, incomplete pain control, headache, nerve damage, paralysis, blood pressure changes, nausea, vomiting, reactions to medication both toxic or allergic, and postpartum back pain.  Patient expressed understanding and wished to proceed.  All questions were answered.  Sterile technique used throughout procedure and epidural site dressed with sterile barrier dressing. No paresthesia or other complications noted.The patient did not experience any signs of intravascular injection such as tinnitus or metallic taste in mouth nor signs of intrathecal spread such as rapid motor block. Please see nursing notes for vital signs.

## 2012-03-02 NOTE — Anesthesia Preprocedure Evaluation (Addendum)
Anesthesia Evaluation  Patient identified by MRN, date of birth, ID band Patient awake    Reviewed: Allergy & Precautions, H&P , NPO status , Patient's Chart, lab work & pertinent test results, reviewed documented beta blocker date and time   History of Anesthesia Complications Negative for: history of anesthetic complications  Airway Mallampati: II TM Distance: >3 FB Neck ROM: full    Dental  (+) Teeth Intact   Pulmonary neg pulmonary ROS, shortness of breath, asthma , pneumonia -, resolved,  breath sounds clear to auscultation        Cardiovascular negative cardio ROS  Rhythm:regular Rate:Normal     Neuro/Psych  Headaches, PSYCHIATRIC DISORDERS negative neurological ROS  negative psych ROS   GI/Hepatic negative GI ROS, Neg liver ROS,   Endo/Other  negative endocrine ROSMorbid obesity  Renal/GU negative Renal ROS     Musculoskeletal   Abdominal   Peds  Hematology negative hematology ROS (+) anemia ,   Anesthesia Other Findings Hypertension     Anginal pain AGE 21 OCC SX    Pneumonia   AS CHILD Depression AGE 80 NO MEDS; NOT DIAGNOSED    Anxiety AGE 80 NO MEDS;  NOT DIAGNOSED Pregnancy induced hypertension 2012      Preterm labor 2012   Infection   UTI OCC    Infection   YEAST X 1 Shortness of breath   WITH ANXIETY    Anemia   LONG TERM Fracture of wrist AGE 69      Fracture, finger AGE 69   Fracture, ankle AGE 21      Headache   MIGRAINES;  TYLENOL H/O varicella        Abnormal Pap smear 09/07/2011   Yeast infection        Bacterial infection     Asthma   CHILDHOOD    Reproductive/Obstetrics (+) Pregnancy                          Anesthesia Physical Anesthesia Plan  ASA: III  Anesthesia Plan: Epidural   Post-op Pain Management:    Induction:   Airway Management Planned:   Additional Equipment:   Intra-op Plan:   Post-operative Plan:   Informed Consent: I have reviewed the  patients History and Physical, chart, labs and discussed the procedure including the risks, benefits and alternatives for the proposed anesthesia with the patient or authorized representative who has indicated his/her understanding and acceptance.     Plan Discussed with:   Anesthesia Plan Comments:         Anesthesia Quick Evaluation

## 2012-03-03 DIAGNOSIS — D649 Anemia, unspecified: Secondary | ICD-10-CM

## 2012-03-03 LAB — CBC
HCT: 28.7 % — ABNORMAL LOW (ref 36.0–46.0)
Hemoglobin: 9.6 g/dL — ABNORMAL LOW (ref 12.0–15.0)
MCV: 84.9 fL (ref 78.0–100.0)
RBC: 3.38 MIL/uL — ABNORMAL LOW (ref 3.87–5.11)
WBC: 16.2 10*3/uL — ABNORMAL HIGH (ref 4.0–10.5)

## 2012-03-03 MED ORDER — IBUPROFEN 600 MG PO TABS: 600.0000 mg | ORAL_TABLET | Freq: Four times a day (QID) | ORAL | Status: DC | PRN

## 2012-03-03 MED ORDER — INFLUENZA VIRUS VACC SPLIT PF IM SUSP
0.5000 mL | Freq: Once | INTRAMUSCULAR | Status: AC
  Administered 2012-03-03: 0.5 mL via INTRAMUSCULAR
  Filled 2012-03-03: qty 0.5

## 2012-03-03 MED ORDER — SENNOSIDES-DOCUSATE SODIUM 8.6-50 MG PO TABS: 2.0000 | ORAL_TABLET | Freq: Every day | ORAL | Status: DC

## 2012-03-03 MED ORDER — RHO D IMMUNE GLOBULIN 1500 UNIT/2ML IJ SOLN
300.0000 ug | Freq: Once | INTRAMUSCULAR | Status: AC
  Administered 2012-03-03: 300 ug via INTRAMUSCULAR
  Filled 2012-03-03: qty 2

## 2012-03-03 NOTE — Discharge Summary (Signed)
Physician Discharge Summary  Patient ID: Katelyn Munoz MRN: [id number] DOB/AGE: 1990-04-11 21 y.o.  Admit date: 03/01/2012 Discharge date: 03/03/2012  Admission Diagnoses: [redacted]w[redacted]d   Discharge Diagnoses:  Active Problems:  Rh negative state in antepartum period  SVD (spontaneous vaginal delivery)  Anemia   Discharged Condition: stable  Hospital Course: [redacted]w[redacted]d, SVD, periurethral lac, normal involution, anemia, plans BTL to sign papers today.}  Discharge Exam: Blood pressure 124/75, pulse 73, temperature 97.9 F (36.6 C), temperature source Oral, resp. rate 20, height 5\' 3"  (1.6 m), weight 221 lb (100.245 kg), last menstrual period 05/26/2011, SpO2 100.00%, unknown if currently breastfeeding.  S: comfortable, little bleeding, slept, back pain down spine with change position to lay down  bottlefeeding  O BP 124/75  Pulse 73  Temp 97.9 F (36.6 C) (Oral)  Resp 20  Ht 5\' 3"  (1.6 m)  Wt 221 lb (100.245 kg)  BMI 39.15 kg/m2  SpO2 100%  LMP 05/26/2011  Breastfeeding? Unknown  abd soft, nt, ff  sm flowperineum clean intact  -Homans sign bilaterally,  Trace edema    Component  Value  Date/Time    HGB  9.6*  03/03/2012 0530    HCT  28.7*  03/03/2012 0530       Disposition: 01-Home or Self Care  Discharge Orders    Future Orders Please Complete By Expires   Discharge instructions      Scheduling Instructions:   Purchase iron 325 mg take 2 times daily for 6 weeks, continue prenatal vitamin, do not take vitamin at same time as iron, do not take iron with dairy like milk, yogurt, cheese   Discharge patient          Medication List     As of 03/03/2012  2:37 PM    STOP taking these medications         cyclobenzaprine 10 MG tablet   Commonly known as: FLEXERIL      TAKE these medications         ibuprofen 600 MG tablet   Commonly known as: ADVIL,MOTRIN   Take 1 tablet (600 mg total) by mouth every 6 (six) hours as needed for pain.      nystatin-triamcinolone  ointment   Commonly known as: MYCOLOG   Apply topically 2 (two) times daily.      prenatal multivitamin Tabs   Take 1 tablet by mouth every morning.      senna-docusate 8.6-50 MG per tablet   Commonly known as: Senokot-S   Take 2 tablets by mouth at bedtime.           Follow-up Information    Follow up with [facility]. In 4 weeks.   Contact information:   [address] [place] [address] [phone]       P discussed anemia, plans BTL f/o 4 weeks, OTC iron bid, measures to avoid constipation, discussed c/o of back pain with CRNA and she will revisit with pt.   SignedLavera Guise 03/03/2012, 2:37 PM

## 2012-03-03 NOTE — Progress Notes (Signed)
S: comfortable, little bleeding, slept, back pain down spine with change position to lay down     bottlefeeding O BP 124/75  Pulse 73  Temp 97.9 F (36.6 C) (Oral)  Resp 20  Ht 5\' 3"  (1.6 m)  Wt 221 lb (100.245 kg)  BMI 39.15 kg/m2  SpO2 100%  LMP 05/26/2011  Breastfeeding? Unknown     abd soft, nt, ff      sm  flowperineum clean intact     -Homans sign bilaterally,       Trace edema    Component Value Date/Time   HGB 9.6* 03/03/2012 0530   HCT 28.7* 03/03/2012 0530   A normal involution     Lactating     PP day 1     anemia P discussed anemia, plans BTL f/o 4 weeks, continue care Lavera Guise, CNM

## 2012-03-03 NOTE — Anesthesia Postprocedure Evaluation (Signed)
  Anesthesia Post-op Note  Patient: Katelyn Munoz  Procedure(s) Performed: * No procedures listed *  Patient Location: PACU and Mother/Baby  Anesthesia Type:Epidural  Level of Consciousness: awake, alert , oriented and patient cooperative  Airway and Oxygen Therapy: Patient Spontanous Breathing  Post-op Pain: mild  Post-op Assessment: Patient's Cardiovascular Status Stable, Respiratory Function Stable, Patent Airway, No signs of Nausea or vomiting, Adequate PO intake and Pain level controlled  Post-op Vital Signs: Reviewed and stable  Complications: No apparent anesthesia complications

## 2012-03-03 NOTE — Progress Notes (Signed)
UR chart review completed.  

## 2012-03-04 LAB — RH IG WORKUP (INCLUDES ABO/RH)
ABO/RH(D): A NEG
Fetal Screen: NEGATIVE
Unit division: 0

## 2012-04-01 ENCOUNTER — Encounter: Payer: Self-pay | Admitting: Obstetrics and Gynecology

## 2012-04-01 ENCOUNTER — Ambulatory Visit (INDEPENDENT_AMBULATORY_CARE_PROVIDER_SITE_OTHER): Payer: Medicaid Other | Admitting: Obstetrics and Gynecology

## 2012-04-01 NOTE — Progress Notes (Signed)
HISTORY OF PRESENT ILLNESS  [name] is a 22 y.o. year old female,G3P2012, who presents for a postpartum visit. She had a vaginal delivery of a healthy female on March 02, 2012.  Subjective:  The patient desires permanent sterilization.  Objective:  BP 120/62  Temp 97.8 F (36.6 C)  Ht 5\' 2"  (1.575 m)  Wt 201 lb (91.173 kg)  BMI 36.76 kg/m2   Resp: clear to auscultation bilaterally Cardio: regular rate and rhythm, S1, S2 normal, no murmur, click, rub or gallop GI: soft and nontender  External genitalia: normal general appearance Vaginal: normal without tenderness, induration or masses and relaxation noted Cervix: nontender Adnexa: normal bimanual exam Uterus: normal size shape and consistency  E PDS equals 14  Bottlefeeding  Assessment:  Doing well status post vaginal delivery  Desires sterilization  Plan:  Counseling discussed for her postpartum feelings.  The patient reports that she has a good support system and she feels that their help is most appropriate.  We will schedule a laparoscopic tubal cautery.  The patient will sign tubal papers today.  Followup care at [facility]  The patient will use condoms if she becomes sexually active before her procedure.  Leonard Schwartz M.D.  04/01/2012 7:27 PM    Date of delivery: 03/02/2012 Female Name: Katelyn Munoz Vaginal delivery:yes Cesarean section:no Tubal ligation:no GDM:no Breast Feeding:no Bottle Feeding:yes Post-Partum Blues:yes Abnormal pap:yes Normal GU function: yes Normal GI function:yes Returning to work:no EPDS: Score 14

## 2012-04-02 ENCOUNTER — Encounter: Payer: Self-pay | Admitting: Obstetrics and Gynecology

## 2012-04-30 ENCOUNTER — Telehealth: Payer: Self-pay | Admitting: Obstetrics and Gynecology

## 2012-05-25 ENCOUNTER — Other Ambulatory Visit: Payer: Self-pay | Admitting: Obstetrics and Gynecology

## 2012-05-25 ENCOUNTER — Telehealth: Payer: Self-pay | Admitting: Obstetrics and Gynecology

## 2012-05-25 NOTE — Telephone Encounter (Signed)
Tubal Sterilization procedure scheduled for 06/09/12 @ 1:00pm With AVS. Patient has MCD. -Katelyn Munoz

## 2012-06-01 ENCOUNTER — Telehealth: Payer: Self-pay | Admitting: Obstetrics and Gynecology

## 2012-06-01 NOTE — Telephone Encounter (Signed)
Tubal Sterilization procedure rescheduled to 07/05/12 @ 1:30pm With AVS. Patient has MCD. -[name]

## 2012-06-02 ENCOUNTER — Telehealth: Payer: Self-pay | Admitting: Obstetrics and Gynecology

## 2012-06-02 NOTE — Telephone Encounter (Signed)
Tubal Sterilization procedure scheduled for 06/09/12 @ 1:00pm With AVS. Patient has MCD. -[name]  ° °

## 2012-06-08 ENCOUNTER — Encounter (HOSPITAL_COMMUNITY): Payer: Self-pay | Admitting: *Deleted

## 2012-06-08 ENCOUNTER — Telehealth: Payer: Self-pay | Admitting: Obstetrics and Gynecology

## 2012-06-08 NOTE — H&P (Addendum)
Admission History and Physical Exam for a Gynecology Patient  Katelyn Munoz is a 22 y.o. female, [id number], who presents for a tubal sterilization procedure.  She desires sterilization. She has been followed at the [facility] and [facility] division of [facility].  OB History   Grav Para Term Preterm Abortions TAB SAB Ect Mult Living   3 2 2  1  1   2      Obstetric Comments   2012 :PTL AT 72 WK; PIH;  MIGRAINES PP; +GBS; [id number]      Past Medical History  Diagnosis Date  . Depression AGE 58    NO MEDS; NOT DIAGNOSED  . Anxiety AGE 58    NO MEDS;  NOT DIAGNOSED  . Preterm labor 2012  . Infection     UTI OCC  . Infection     YEAST X 1  . Shortness of breath     WITH ANXIETY  . Anemia     LONG TERM  . Fracture of wrist AGE 8  . Fracture, finger AGE 8  . Fracture, ankle AGE 14  . Headache     MIGRAINES;  TYLENOL  . H/O varicella   . Abnormal Pap smear 09/07/2011  . Yeast infection   . Bacterial infection   . Asthma     CHILDHOOD  . Anginal pain AGE 41    no problems currently  . Hypertension     pregnanacy only  . Pregnancy induced hypertension 2012    No prescriptions prior to admission    Past Surgical History  Procedure Laterality Date  . Appendectomy  AGE 81    Allergies  Allergen Reactions  . Tree Extract     Cedar trees  resp problems if near one boils and rash if contact    Family History: family history includes Alcohol abuse in her father and mother; Arthritis in her father and maternal aunt; Bipolar disorder in her mother; Cancer in her maternal grandmother; Depression in her mother; Diabetes in her paternal grandmother; Drug abuse in her mother; Early death (age of onset: 23) in her paternal grandmother; Heart disease in her maternal grandfather; Hyperlipidemia in her maternal aunt; Hypertension in her father and maternal aunt; Lung cancer in her maternal grandfather; and Parkinsonism in her maternal  grandfather.  Social History:  reports that she has been smoking Cigarettes.  She has a 3.5 pack-year smoking history. She has never used smokeless tobacco. She reports that  drinks alcohol. She reports that she does not use illicit drugs.  Review of systems: See HPI.  Admission Physical Exam:    There is no weight on file to calculate BMI.  Last menstrual period 05/19/2012, not currently breastfeeding.  HEENT:                 Within normal limits Chest:                   Clear Heart:                    Regular rate and rhythm Breasts:                No masses, skin changes, bleeding, or discharge present Abdomen:             Nontender, no masses Extremities:          Grossly normal Neurologic exam: Grossly normal  Pelvic exam:  External genitalia: normal general appearance Vaginal: normal without  tenderness, induration or masses Cervix: normal appearance Adnexa: normal bimanual exam Uterus: normal size shape and consistency  Assessment:  Desires sterilization  Plan:  Tubal sterilization procedure. I attempted to call the patient tonight but none of her telephone numbers are working.   Janine Limbo 06/08/2012

## 2012-06-09 ENCOUNTER — Encounter (HOSPITAL_COMMUNITY)
Admission: RE | Disposition: A | Discharge status: Home / Self Care | Payer: Self-pay | Source: Ambulatory Visit | Attending: Obstetrics and Gynecology

## 2012-06-09 ENCOUNTER — Encounter (HOSPITAL_COMMUNITY): Payer: Self-pay | Admitting: *Deleted

## 2012-06-09 ENCOUNTER — Ambulatory Visit (HOSPITAL_COMMUNITY)
Admission: RE | Admit: 2012-06-09 | Discharge: 2012-06-09 | Disposition: A | Discharge status: Home / Self Care | Payer: Medicaid Other | Source: Ambulatory Visit | Attending: Obstetrics and Gynecology | Admitting: Obstetrics and Gynecology

## 2012-06-09 ENCOUNTER — Encounter (HOSPITAL_COMMUNITY): Payer: Self-pay | Admitting: Registered Nurse

## 2012-06-09 ENCOUNTER — Ambulatory Visit (HOSPITAL_COMMUNITY): Payer: Medicaid Other | Admitting: Registered Nurse

## 2012-06-09 DIAGNOSIS — E669 Obesity, unspecified: Secondary | ICD-10-CM | POA: Insufficient documentation

## 2012-06-09 DIAGNOSIS — Z302 Encounter for sterilization: Secondary | ICD-10-CM | POA: Insufficient documentation

## 2012-06-09 HISTORY — PX: LAPAROSCOPIC TUBAL LIGATION: SHX1937

## 2012-06-09 LAB — SURGICAL PCR SCREEN
MRSA, PCR: NEGATIVE
Staphylococcus aureus: POSITIVE — AB

## 2012-06-09 LAB — CBC
HCT: 34.9 % — ABNORMAL LOW (ref 36.0–46.0)
Hemoglobin: 12.1 g/dL (ref 12.0–15.0)
RBC: 4.2 MIL/uL (ref 3.87–5.11)

## 2012-06-09 LAB — PREGNANCY, URINE: Preg Test, Ur: NEGATIVE

## 2012-06-09 SURGERY — LIGATION, FALLOPIAN TUBE, LAPAROSCOPIC
Anesthesia: General | Site: Abdomen | Laterality: Bilateral | Wound class: Clean

## 2012-06-09 MED ORDER — ROCURONIUM BROMIDE 50 MG/5ML IV SOLN
INTRAVENOUS | Status: AC
  Filled 2012-06-09: qty 1

## 2012-06-09 MED ORDER — LACTATED RINGERS IV SOLN
INTRAVENOUS | Status: DC
  Administered 2012-06-09: 12:00:00 via INTRAVENOUS

## 2012-06-09 MED ORDER — ONDANSETRON HCL 4 MG/2ML IJ SOLN
INTRAMUSCULAR | Status: DC | PRN
  Administered 2012-06-09: 4 mg via INTRAVENOUS

## 2012-06-09 MED ORDER — OXYCODONE-ACETAMINOPHEN 5-325 MG PO TABS
ORAL_TABLET | ORAL | Status: AC
  Administered 2012-06-09: 1 via ORAL
  Filled 2012-06-09: qty 1

## 2012-06-09 MED ORDER — DEXAMETHASONE SODIUM PHOSPHATE 10 MG/ML IJ SOLN
INTRAMUSCULAR | Status: AC
  Filled 2012-06-09: qty 1

## 2012-06-09 MED ORDER — NEOSTIGMINE METHYLSULFATE 1 MG/ML IJ SOLN
INTRAMUSCULAR | Status: DC | PRN
  Administered 2012-06-09: 3 mg via INTRAVENOUS

## 2012-06-09 MED ORDER — PROMETHAZINE HCL 12.5 MG PO TABS: 12.5000 mg | ORAL_TABLET | Freq: Four times a day (QID) | ORAL | Status: AC | PRN

## 2012-06-09 MED ORDER — ROCURONIUM BROMIDE 100 MG/10ML IV SOLN
INTRAVENOUS | Status: DC | PRN
  Administered 2012-06-09: 40 mg via INTRAVENOUS

## 2012-06-09 MED ORDER — LIDOCAINE HCL (CARDIAC) 20 MG/ML IV SOLN
INTRAVENOUS | Status: AC
  Filled 2012-06-09: qty 5

## 2012-06-09 MED ORDER — MIDAZOLAM HCL 2 MG/2ML IJ SOLN
INTRAMUSCULAR | Status: AC
  Filled 2012-06-09: qty 2

## 2012-06-09 MED ORDER — KETOROLAC TROMETHAMINE 30 MG/ML IJ SOLN
INTRAMUSCULAR | Status: DC | PRN
  Administered 2012-06-09: 30 mg via INTRAVENOUS
  Administered 2012-06-09: 30 mg via INTRAMUSCULAR

## 2012-06-09 MED ORDER — BUPIVACAINE-EPINEPHRINE 0.5% -1:200000 IJ SOLN
INTRAMUSCULAR | Status: DC | PRN
  Administered 2012-06-09: 7 mL

## 2012-06-09 MED ORDER — OXYCODONE-ACETAMINOPHEN 5-325 MG PO TABS: 1.0000 | ORAL_TABLET | Freq: Once | ORAL | Status: AC

## 2012-06-09 MED ORDER — LIDOCAINE HCL (CARDIAC) 20 MG/ML IV SOLN
INTRAVENOUS | Status: DC | PRN
  Administered 2012-06-09: 50 mg via INTRAVENOUS

## 2012-06-09 MED ORDER — GLYCOPYRROLATE 0.2 MG/ML IJ SOLN
INTRAMUSCULAR | Status: DC | PRN
  Administered 2012-06-09: 0.6 mg via INTRAVENOUS

## 2012-06-09 MED ORDER — 0.9 % SODIUM CHLORIDE (POUR BTL) OPTIME
TOPICAL | Status: DC | PRN
  Administered 2012-06-09: 1000 mL

## 2012-06-09 MED ORDER — FENTANYL CITRATE 0.05 MG/ML IJ SOLN: 25.0000 ug | INTRAMUSCULAR | Status: DC | PRN

## 2012-06-09 MED ORDER — FENTANYL CITRATE 0.05 MG/ML IJ SOLN
INTRAMUSCULAR | Status: AC
  Filled 2012-06-09: qty 5

## 2012-06-09 MED ORDER — OXYCODONE-ACETAMINOPHEN 5-325 MG PO TABS: 1.0000 | ORAL_TABLET | ORAL | Status: AC | PRN

## 2012-06-09 MED ORDER — PROPOFOL 10 MG/ML IV EMUL
INTRAVENOUS | Status: AC
  Filled 2012-06-09: qty 20

## 2012-06-09 MED ORDER — FENTANYL CITRATE 0.05 MG/ML IJ SOLN
INTRAMUSCULAR | Status: AC
  Administered 2012-06-09: 50 ug via INTRAVENOUS
  Filled 2012-06-09: qty 2

## 2012-06-09 MED ORDER — LACTATED RINGERS IV SOLN
INTRAVENOUS | Status: DC | PRN
  Administered 2012-06-09: 14:00:00 via INTRAVENOUS

## 2012-06-09 MED ORDER — BUPIVACAINE-EPINEPHRINE (PF) 0.5% -1:200000 IJ SOLN
INTRAMUSCULAR | Status: AC
  Filled 2012-06-09: qty 10

## 2012-06-09 MED ORDER — FENTANYL CITRATE 0.05 MG/ML IJ SOLN
INTRAMUSCULAR | Status: DC | PRN
  Administered 2012-06-09 (×2): 100 ug via INTRAVENOUS
  Administered 2012-06-09: 50 ug via INTRAVENOUS

## 2012-06-09 MED ORDER — MUPIROCIN 2 % EX OINT: TOPICAL_OINTMENT | Freq: Two times a day (BID) | CUTANEOUS | Status: DC

## 2012-06-09 MED ORDER — PROPOFOL 10 MG/ML IV BOLUS
INTRAVENOUS | Status: DC | PRN
  Administered 2012-06-09: 200 mg via INTRAVENOUS

## 2012-06-09 MED ORDER — ONDANSETRON HCL 4 MG/2ML IJ SOLN
INTRAMUSCULAR | Status: AC
  Filled 2012-06-09: qty 2

## 2012-06-09 MED ORDER — IBUPROFEN 800 MG PO TABS: 800.0000 mg | ORAL_TABLET | Freq: Three times a day (TID) | ORAL | Status: AC | PRN

## 2012-06-09 MED ORDER — MIDAZOLAM HCL 5 MG/5ML IJ SOLN
INTRAMUSCULAR | Status: DC | PRN
  Administered 2012-06-09: 2 mg via INTRAVENOUS

## 2012-06-09 MED ORDER — MUPIROCIN 2 % EX OINT
TOPICAL_OINTMENT | CUTANEOUS | Status: AC
  Filled 2012-06-09: qty 22

## 2012-06-09 MED ORDER — DEXAMETHASONE SODIUM PHOSPHATE 10 MG/ML IJ SOLN
INTRAMUSCULAR | Status: DC | PRN
  Administered 2012-06-09: 10 mg via INTRAVENOUS

## 2012-06-09 SURGICAL SUPPLY — 16 items
CATH ROBINSON RED A/P 16FR (CATHETERS) ×2 IMPLANT
CHLORAPREP W/TINT 26ML (MISCELLANEOUS) ×2 IMPLANT
CLOTH BEACON ORANGE TIMEOUT ST (SAFETY) ×2 IMPLANT
DERMABOND ADHESIVE PROPEN (GAUZE/BANDAGES/DRESSINGS) ×1
DERMABOND ADVANCED .7 DNX6 (GAUZE/BANDAGES/DRESSINGS) ×1 IMPLANT
DRSG COVADERM PLUS 2X2 (GAUZE/BANDAGES/DRESSINGS) ×2 IMPLANT
GLOVE BIOGEL PI IND STRL 8.5 (GLOVE) ×1 IMPLANT
GLOVE BIOGEL PI INDICATOR 8.5 (GLOVE) ×1
GLOVE ECLIPSE 8.0 STRL XLNG CF (GLOVE) ×4 IMPLANT
GOWN PREVENTION PLUS LG XLONG (DISPOSABLE) ×4 IMPLANT
GOWN PREVENTION PLUS XXLARGE (GOWN DISPOSABLE) ×2 IMPLANT
PACK LAPAROSCOPY BASIN (CUSTOM PROCEDURE TRAY) ×2 IMPLANT
SUT MNCRL AB 4-0 PS2 18 (SUTURE) ×2 IMPLANT
SUT VICRYL 0 UR6 27IN ABS (SUTURE) ×2 IMPLANT
TOWEL OR 17X24 6PK STRL BLUE (TOWEL DISPOSABLE) ×4 IMPLANT
WATER STERILE IRR 1000ML POUR (IV SOLUTION) ×2 IMPLANT

## 2012-06-09 NOTE — Transfer of Care (Signed)
Immediate Anesthesia Transfer of Care Note  Patient: Katelyn Munoz  Procedure(s) Performed: Procedure(s): LAPAROSCOPIC TUBAL LIGATION (Bilateral)  Patient Location: PACU  Anesthesia Type:General  Level of Consciousness: awake, alert , oriented and patient cooperative  Airway & Oxygen Therapy: Patient Spontanous Breathing and Patient connected to nasal cannula oxygen  Post-op Assessment: Report given to PACU RN, Post -op Vital signs reviewed and stable and Patient moving all extremities X 4  Post vital signs: Reviewed and stable  Complications: No apparent anesthesia complications

## 2012-06-09 NOTE — Op Note (Signed)
OPERATIVE NOTE  Katelyn Munoz  DOB:    1990/05/21  MRN:    [id number]  CSN:    [id number]  Date of Surgery:  06/09/2012  Preoperative Diagnosis:  Desires sterilization  Obesity  Postoperative Diagnosis:  Desires sterilization   Obesity  Procedure:  Laparoscopic Tubal Cautery  Surgeon:  Leonard Schwartz, M.D.  Assistant:  None  Anesthetic:  General  Disposition:  Katelyn Munoz is a 22 y.o. year old female,G3P2012, who presents for a tubal sterilization procedure. She understands the indications for her operation as well as the alternative treatment options. She accepts the risk of, but not limited to, anesthetic complications, bleeding, infections, possible damage to the surrounding organs, and possible tubal failure (17 per 1000). The patient decided against bilateral salpingectomies.  Findings:  The uterus, fallopian tubes, and ovaries were normal. The bowel and the liver appeared normal.  Procedure:  The patient was taken to the operating room where a general anesthetic was given. The patient's abdomen, perineum, and vagina were prepped with sterile solution. The bladder was drained of urine. An examination under anesthesia was performed. A Hulka tenaculum was placed inside the uterus. The patient was sterilely draped. The subumbilical area was injected with half percent Marcaine with epinephrine. A subumbilical incision was made and the Veress needle was inserted into the abdominal cavity without difficulty. Proper placement was confirmed using the fluid drop test. A pneumoperitoneum was obtained. The laparoscopic trocar and the laparoscope were substituted for the Veress needle. The pelvic organs were inspected with findings as mentioned above. The right fallopian tube was identified and followed to the fimbriated end. The right fallopian tube was cauterized in its proximal portion in several areas until the tube was completely desiccated. Hemostasis was  adequate. An identical procedure was carried out on the opposite side. Again hemostasis was adequate. The pneumoperitoneum was allowed to escape. All instruments were removed. The subumbilical fascia was closed using 0 Vicryl. The skin was reapproximated using 3-0 Monocryl. Sponge and needle counts were correct. The estimated blood loss was less than 2 cc. The patient tolerated her procedure well. She was awakened from her anesthetic without difficulty. She was transported to the recovery room in stable condition.  Followup instructions:  The patient will return to see Dr. Stefano Gaul in 2 weeks. She was given a copy of the postoperative instructions as prepared by the [facility] for patients who've undergone laparoscopy.  Discharge medications:  Motrin 800 mg tablets            One tablet every 8 hours as needed for moderate pain. Percocet 5/325 tablets          One or two tablets every 4 hours as needed for severe pain. Phenergan 12.5 mg tablets   One tablet every 6 hours as needed for nausea.  Leonard Schwartz, M.D.

## 2012-06-09 NOTE — Preoperative (Signed)
Beta Blockers   Reason not to administer Beta Blockers:Not Applicable 

## 2012-06-09 NOTE — H&P (Signed)
The patient was interviewed and examined today.  The previously documented history and physical examination was reviewed. There are no changes. The operative procedure was reviewed. The risks and benefits were outlined again. The specific risks include, but are not limited to, anesthetic complications, bleeding, infections, and possible damage to the surrounding organs. The patient's questions were answered.  We are ready to proceed as outlined. The likelihood of the patient achieving the goals of this procedure is very likely.   BP 130/80  Pulse 90  Temp(Src) 97.9 F (36.6 C) (Oral)  Resp 20  Ht 5\' 2"  (1.575 m)  Wt 202 lb (91.627 kg)  BMI 36.94 kg/m2  SpO2 99%  LMP 05/17/2012  Breastfeeding? No  CBC    Component Value Date/Time   WBC 9.8 06/09/2012 1130   RBC 4.20 06/09/2012 1130   HGB 12.1 06/09/2012 1130   HCT 34.9* 06/09/2012 1130   PLT 339 06/09/2012 1130   MCV 83.1 06/09/2012 1130   MCH 28.8 06/09/2012 1130   MCHC 34.7 06/09/2012 1130   RDW 14.5 06/09/2012 1130   LYMPHSABS 2.6 10/07/2011 1435   MONOABS 0.6 10/07/2011 1435   EOSABS 0.2 10/07/2011 1435   BASOSABS 0.0 10/07/2011 1435      Leonard Schwartz, M.D.

## 2012-06-09 NOTE — Anesthesia Postprocedure Evaluation (Signed)
  Anesthesia Post-op Note  Anesthesia Post Note  Patient: Katelyn Munoz  Procedure(s) Performed: Procedure(s) (LRB): LAPAROSCOPIC TUBAL LIGATION (Bilateral)  Anesthesia type: General  Patient location: PACU  Post pain: Pain level controlled  Post assessment: Post-op Vital signs reviewed  Last Vitals:  Filed Vitals:   06/09/12 1530  BP: 123/67  Pulse: 80  Temp: 36.6 C  Resp: 21    Post vital signs: Reviewed  Level of consciousness: sedated  Complications: No apparent anesthesia complications

## 2012-06-09 NOTE — Anesthesia Preprocedure Evaluation (Addendum)
Anesthesia Evaluation  Patient identified by MRN, date of birth, ID band Patient awake    Reviewed: Allergy & Precautions, H&P , Patient's Chart, lab work & pertinent test results, reviewed documented beta blocker date and time   Airway Mallampati: II TM Distance: >3 FB Neck ROM: full    Dental no notable dental hx. (+) Poor Dentition   Pulmonary  breath sounds clear to auscultation  Pulmonary exam normal       Cardiovascular Rhythm:regular Rate:Normal     Neuro/Psych    GI/Hepatic   Endo/Other    Renal/GU      Musculoskeletal   Abdominal   Peds  Hematology   Anesthesia Other Findings   Reproductive/Obstetrics                          Anesthesia Physical Anesthesia Plan  ASA: III  Anesthesia Plan: General   Post-op Pain Management:    Induction: Intravenous  Airway Management Planned: Oral ETT  Additional Equipment:   Intra-op Plan:   Post-operative Plan:   Informed Consent: I have reviewed the patients History and Physical, chart, labs and discussed the procedure including the risks, benefits and alternatives for the proposed anesthesia with the patient or authorized representative who has indicated his/her understanding and acceptance.   Dental Advisory Given and Dental advisory given  Plan Discussed with: CRNA and Surgeon  Anesthesia Plan Comments: (  Discussed  general anesthesia, including possible nausea, instrumentation of airway, sore throat,pulmonary aspiration, etc. I asked if the were any outstanding questions, or  concerns before we proceeded. )        Anesthesia Quick Evaluation

## 2012-06-10 ENCOUNTER — Encounter (HOSPITAL_COMMUNITY): Payer: Self-pay | Admitting: Obstetrics and Gynecology

## 2013-01-27 ENCOUNTER — Other Ambulatory Visit: Payer: Self-pay

## 2013-06-06 IMAGING — US US OB COMP LESS 14 WK
1 series · 14 of 28 positions shown · non-contrast
Comparison: None.

CLINICAL DATA: Pelvic pain and cramping.  Previous miscarriage. 12-
week-1-day gestational age by LMP.

OBSTETRIC <14 WK ULTRASOUND
TECHNIQUE: Transabdominal ultrasound was performed for evaluation
of the gestation as well as the maternal uterus and adnexal
regions.

[Series 1: us ob comp less 14 wk · 0.28mm/px · 31 acquisitions, 14 frames shown]
[im 2/31]
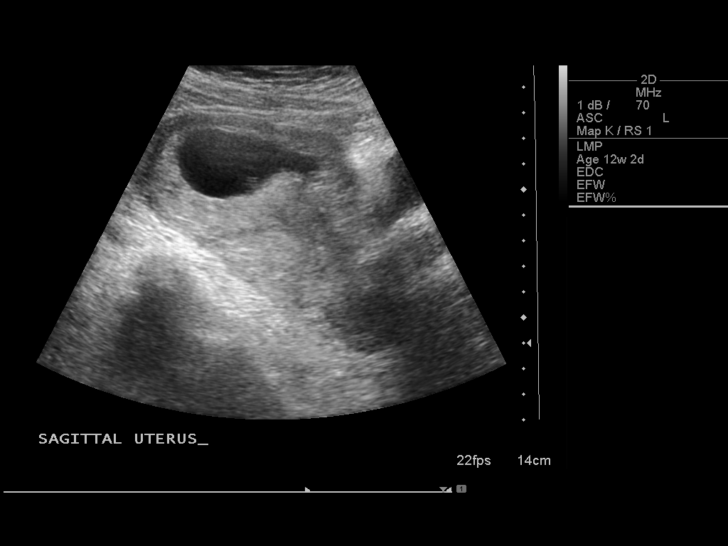
[im 4/31]
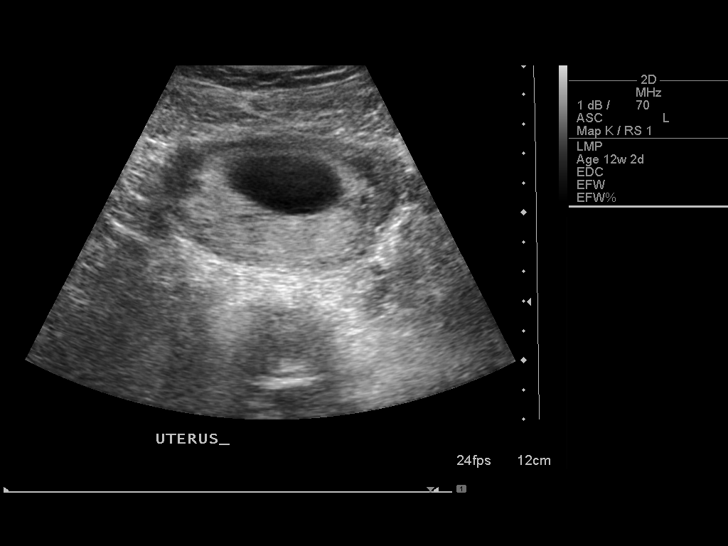
[im 6/31]
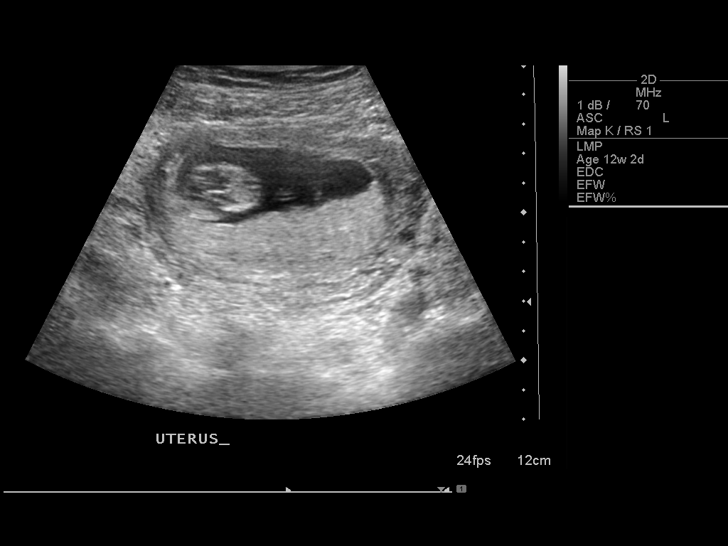
[im 8/31]
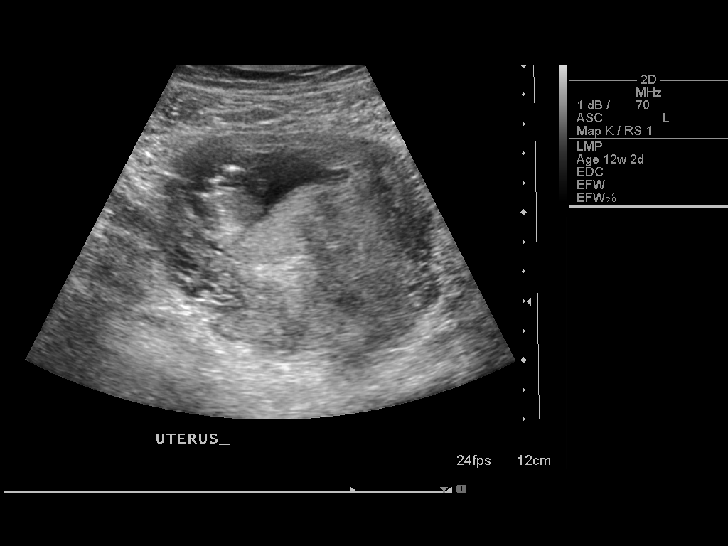
[im 11/31]
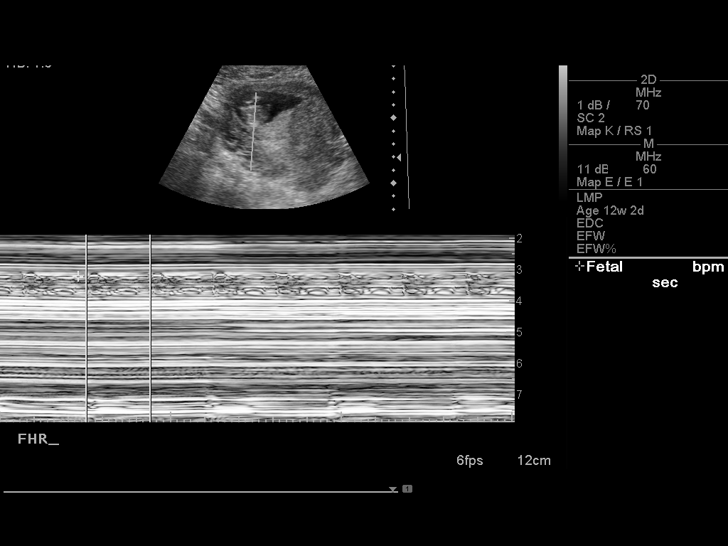
[im 13/31]
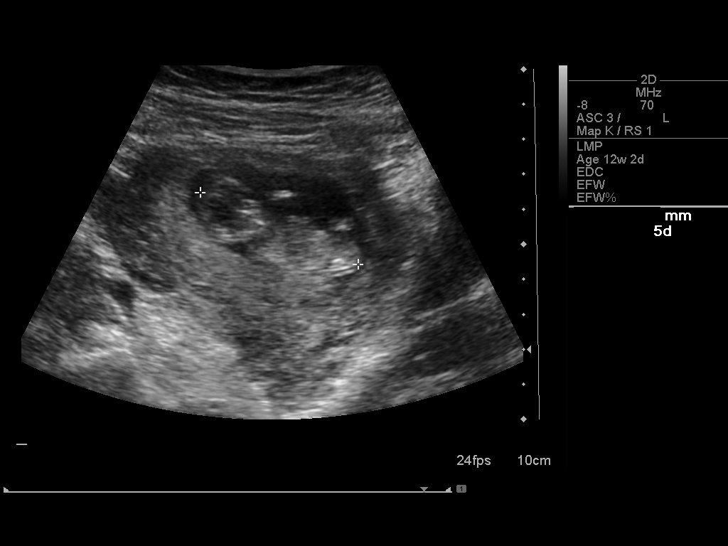
[im 15/31]
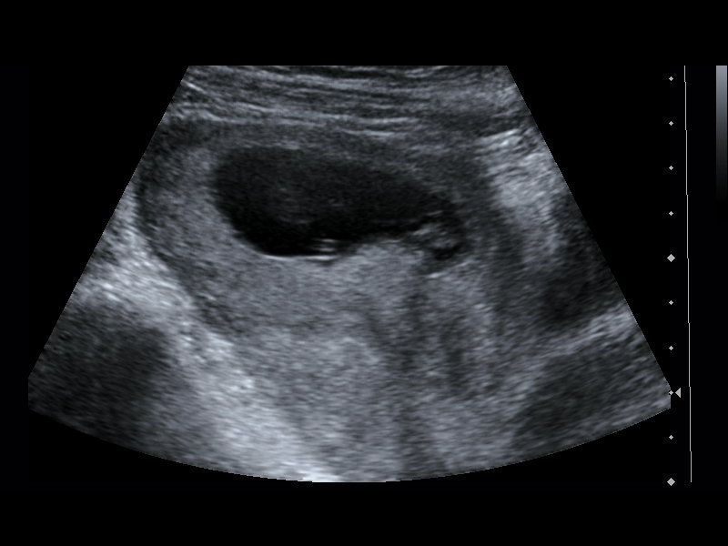
[im 17/31]
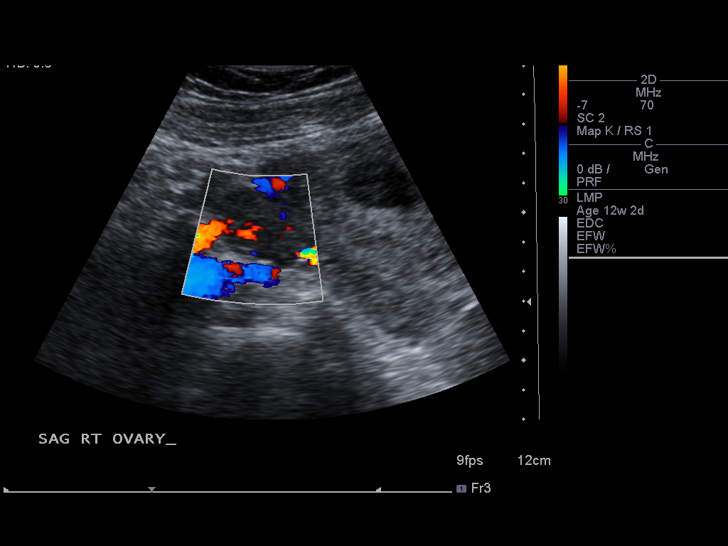
[im 19/31]
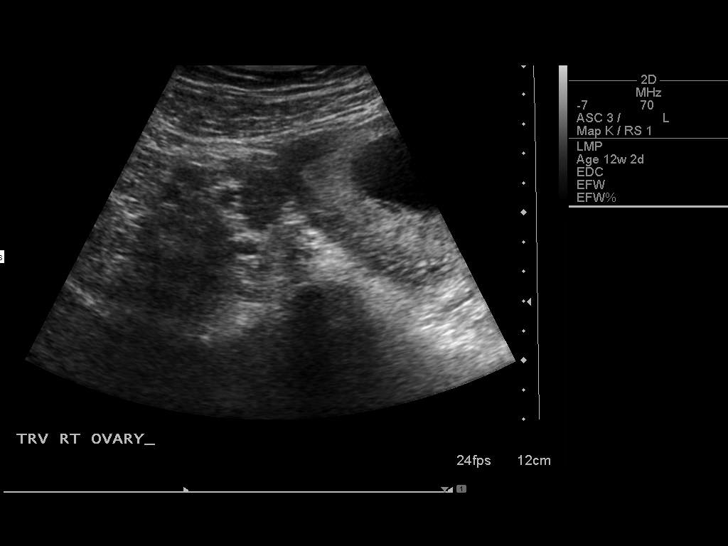
[im 22/31]
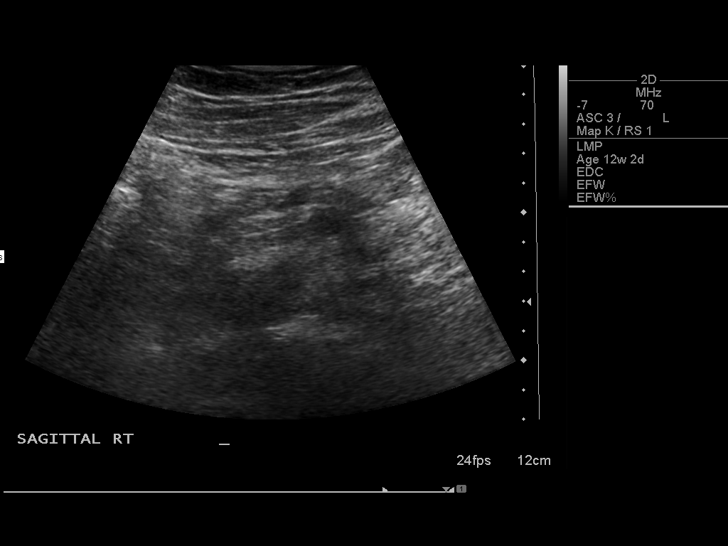
[im 24/31]
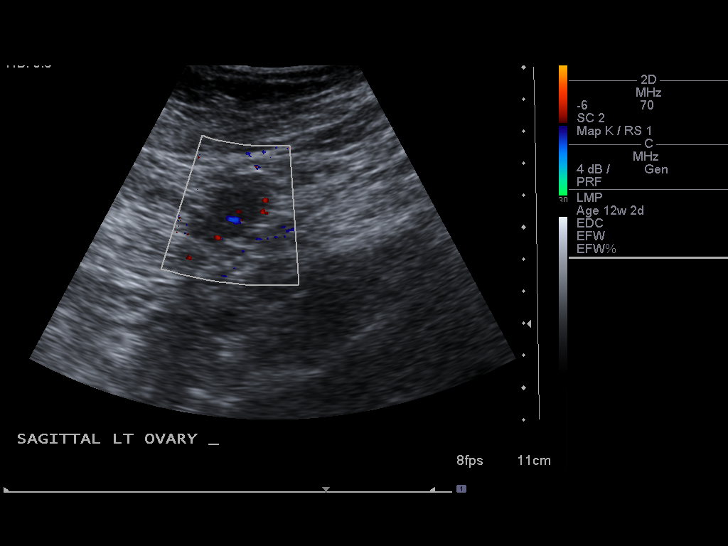
[im 26/31]
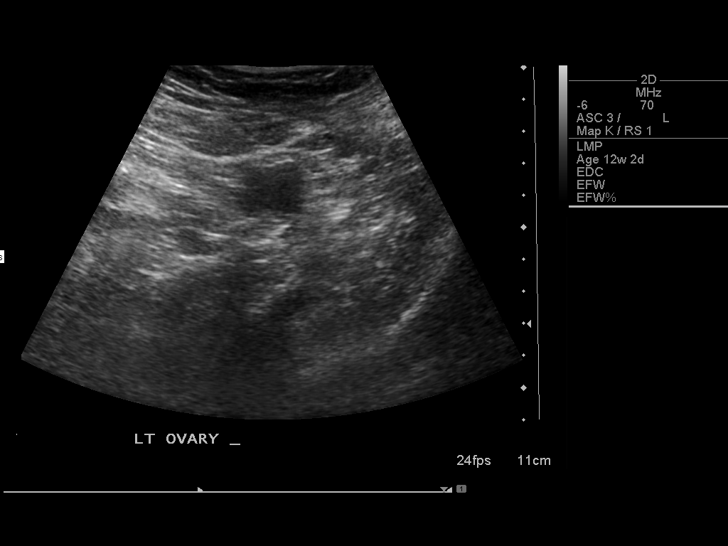
[im 28/31]
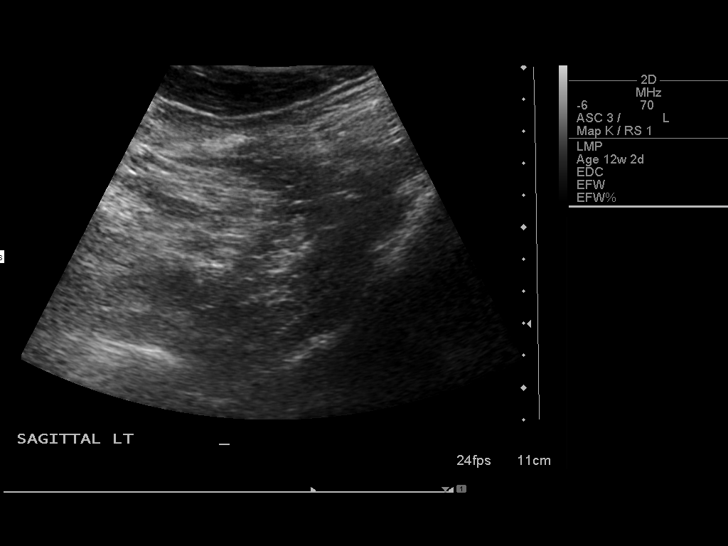
[im 31/31]
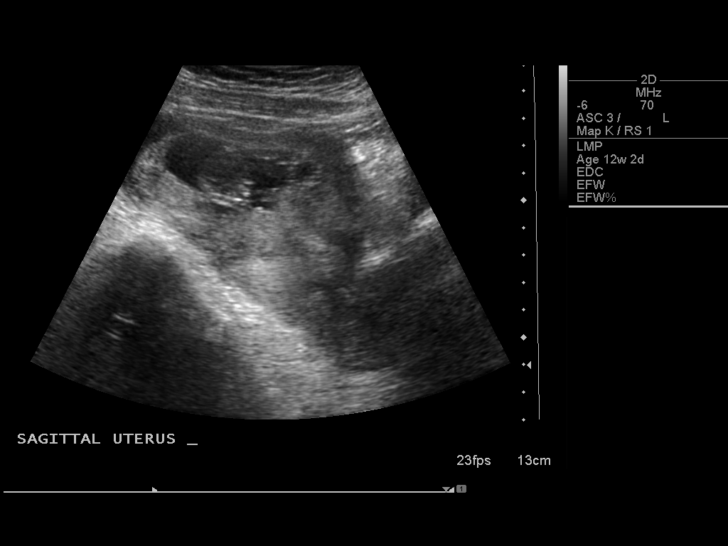

[14 of 28 positions shown; findings below may reference images not displayed]

Intrauterine gestational sac: Visualized/normal in shape.
Yolk sac: Visualized
Embryo: Visualized
Cardiac Activity: Observed
Heart Rate: 160 bpm

CRL:  49 mm  11w  4d          US EDC: 03/06/2012

Maternal uterus/Adnexae:
No abnormalities visualized.  Both ovaries are normal in
appearance.
IMPRESSION: 1.  Single living IUP.  US EGA is concordant with LMP.
2.  No maternal uterine or adnexal abnormality identified.

## 2014-01-23 ENCOUNTER — Encounter (HOSPITAL_COMMUNITY): Payer: Self-pay | Admitting: Obstetrics and Gynecology

## 2016-10-23 NOTE — Telephone Encounter (Signed)
Looking up telephone number.   AVS
